# Patient Record
Sex: Female | Born: 1955 | Race: White | Hispanic: No
Health system: Southern US, Academic
[De-identification: ages and names within clinical notes are randomized; demographics above are authoritative.]

## PROBLEM LIST (undated history)

## (undated) DIAGNOSIS — E119 Type 2 diabetes mellitus without complications: Secondary | ICD-10-CM

## (undated) DIAGNOSIS — E7800 Pure hypercholesterolemia, unspecified: Secondary | ICD-10-CM

## (undated) DIAGNOSIS — K219 Gastro-esophageal reflux disease without esophagitis: Secondary | ICD-10-CM

## (undated) DIAGNOSIS — M129 Arthropathy, unspecified: Secondary | ICD-10-CM

## (undated) DIAGNOSIS — N281 Cyst of kidney, acquired: Secondary | ICD-10-CM

## (undated) DIAGNOSIS — H7290 Unspecified perforation of tympanic membrane, unspecified ear: Secondary | ICD-10-CM

## (undated) DIAGNOSIS — I10 Essential (primary) hypertension: Secondary | ICD-10-CM

## (undated) HISTORY — PX: HX EAR SURGERY: 2100001180

## (undated) HISTORY — PX: ANTERIOR CERVICAL DISCECTOMY W/ FUSION: SHX1161

## (undated) HISTORY — PX: KNEE ARTHROSCOPY: SUR90

---

## 1980-11-22 ENCOUNTER — Other Ambulatory Visit (HOSPITAL_COMMUNITY): Payer: Self-pay | Admitting: OBSTETRICS/GYNECOLOGY

## 2008-04-29 ENCOUNTER — Ambulatory Visit (INDEPENDENT_AMBULATORY_CARE_PROVIDER_SITE_OTHER): Payer: Self-pay | Admitting: Audiologist

## 2008-04-29 ENCOUNTER — Ambulatory Visit (INDEPENDENT_AMBULATORY_CARE_PROVIDER_SITE_OTHER): Payer: Self-pay | Admitting: Otolaryngology

## 2008-04-30 ENCOUNTER — Ambulatory Visit (INDEPENDENT_AMBULATORY_CARE_PROVIDER_SITE_OTHER): Payer: Auto Insurance (includes no fault) | Admitting: Otolaryngology

## 2008-04-30 ENCOUNTER — Ambulatory Visit (INDEPENDENT_AMBULATORY_CARE_PROVIDER_SITE_OTHER): Payer: Auto Insurance (includes no fault) | Admitting: Audiologist
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
## 2008-04-30 NOTE — Progress Notes (Signed)
I saw and evaluated the patient. I reviewed the resident's note. I agree with the findings and plan of care as documented in the resident's note. Any exceptions/additions are noted.

## 2008-06-20 ENCOUNTER — Ambulatory Visit (INDEPENDENT_AMBULATORY_CARE_PROVIDER_SITE_OTHER): Payer: Auto Insurance (includes no fault) | Admitting: Audiologist

## 2008-06-20 NOTE — Progress Notes (Signed)
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  F/u with Dr. Claybon Jabs is recommended. SH

## 2008-06-25 NOTE — Progress Notes (Addendum)
 Auditory Brainstem Response  Click Neurologic, Threshold and 500 Hz Tone Bursts completed.  Neurologically, all absolute and interwave latencies are WNL bilat. At 90 dBnHL.  Wave V followed down to 20 dBnHL for the Left and down to 30 dBnHL for the Right.  Wave V for 500 Hz Tone Bursts observed down to 30 (10 dB correction factor applied,  Places Wave V at 20 dBnHL) for both ears.  IMP: Hearing is WNL bilaterally at 09-3998 Hz and at 500 Hz.  NOTE: Waveform morphology not as clear for Right ear. Pt. Reports that she had some type of  Ear surgery in the right ear at age 52.

## 2008-07-02 ENCOUNTER — Ambulatory Visit (INDEPENDENT_AMBULATORY_CARE_PROVIDER_SITE_OTHER): Payer: Self-pay | Admitting: Otolaryngology

## 2008-07-02 NOTE — Telephone Encounter (Signed)
Triage Queue message copied by Maceo Pro on Tue Jul 02, 2008 10:40 AM  ------   Message from: Arva Chafe   Created: Tue Jul 02, 2008 10:38 AM    >> Arva Chafe Tue Jul 02, 2008 10:38 am  Claybon Jabs pt  This pt would like to know if Dr Claybon Jabs needs to see her again about her hearing. Please call pt to advise.

## 2008-07-10 NOTE — Telephone Encounter (Signed)
I left message on her voice mail that next step is hearing and I do not necessarily need to see her  [facility]  ----- Message -----  From: Timmothy Sours  Sent: Jul 02, 2008 10:40 AM  To: Maceo Pro

## 2008-07-11 ENCOUNTER — Ambulatory Visit (INDEPENDENT_AMBULATORY_CARE_PROVIDER_SITE_OTHER): Payer: Self-pay | Admitting: Otolaryngology

## 2008-07-11 NOTE — Telephone Encounter (Signed)
 Medical records faxed.

## 2008-07-11 NOTE — Telephone Encounter (Signed)
Triage Queue message copied by Valetta Fuller on Thu Jul 11, 2008 3:43 PM  ------   Message from: Arva Chafe   Created: Thu Jul 11, 2008 2:52 PM    >> Arva Chafe Thu Jul 11, 2008 2:52 pm  Claybon Jabs pt  This pt said her PCP, Dr Ilsa Iha would like all of Bette's test results and office notes faxed to [phone]. Thank you

## 2018-09-19 IMAGING — US ABD LIMITED
1 series · 14 of 25 positions shown · non-contrast
Comparison: Ultrasound report dated 03/12/2014.

EXAM:  SILAWAT PROFESSIONAL READ ABD U/S LMTD
INDICATION: K 76.0.

[Series 1: abd limited · 14 of 60 slices shown]
[im 1/60]
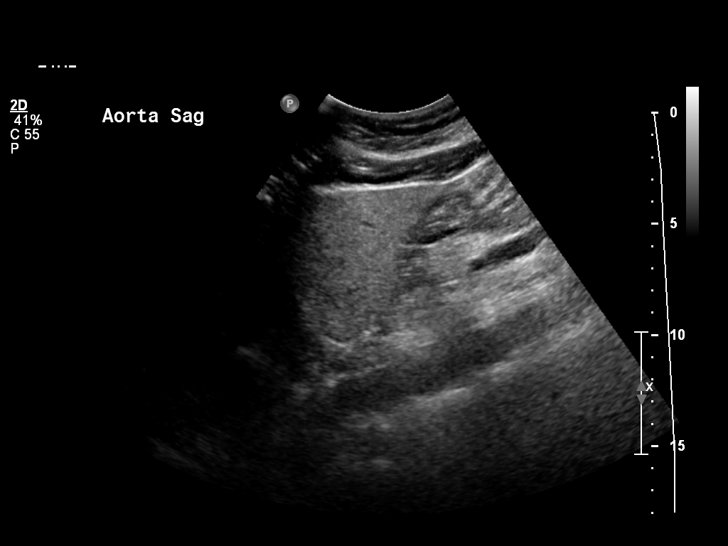
[im 5/60]
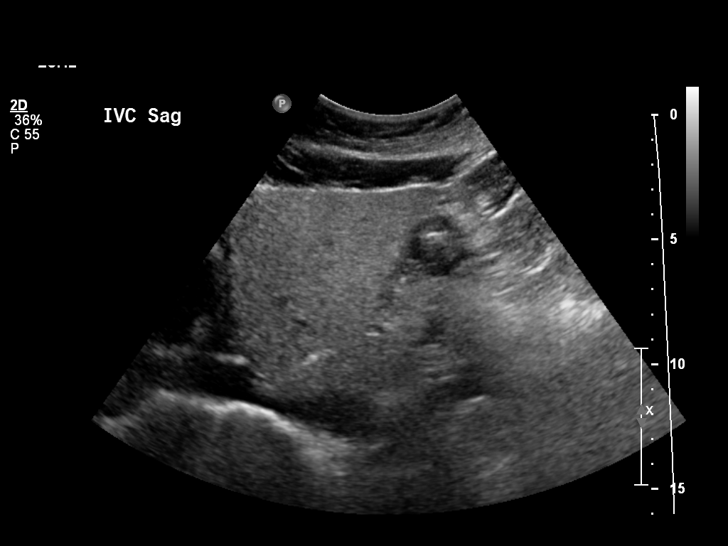
[im 10/60]
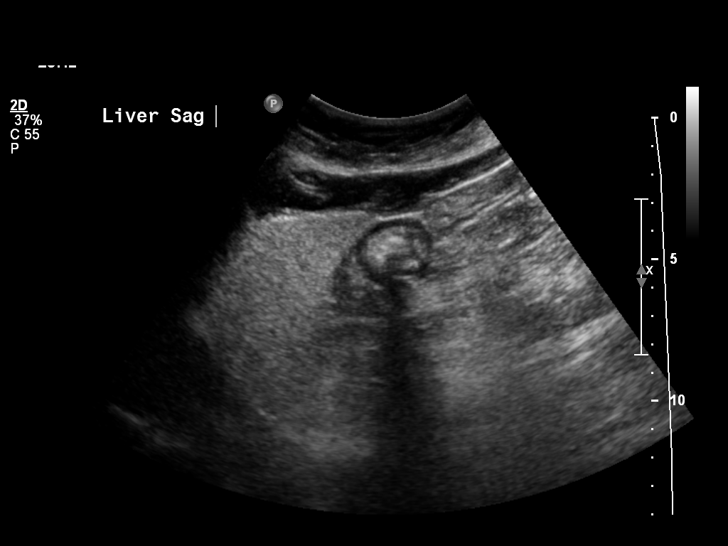
[im 15/60]
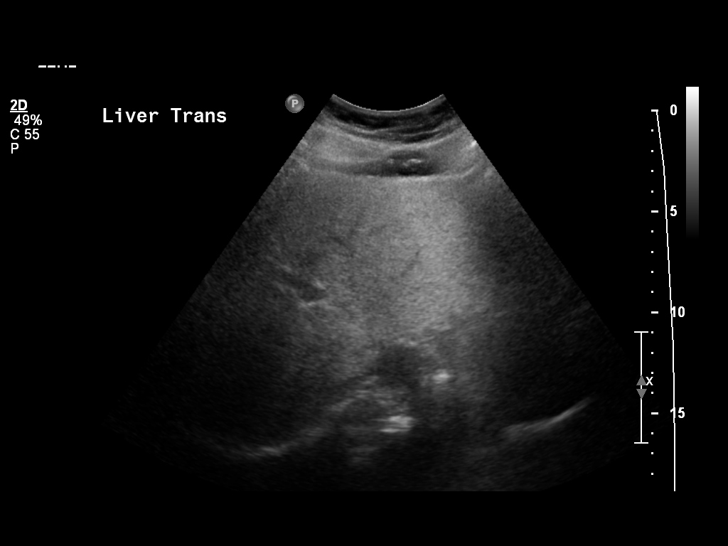
[im 20/60]
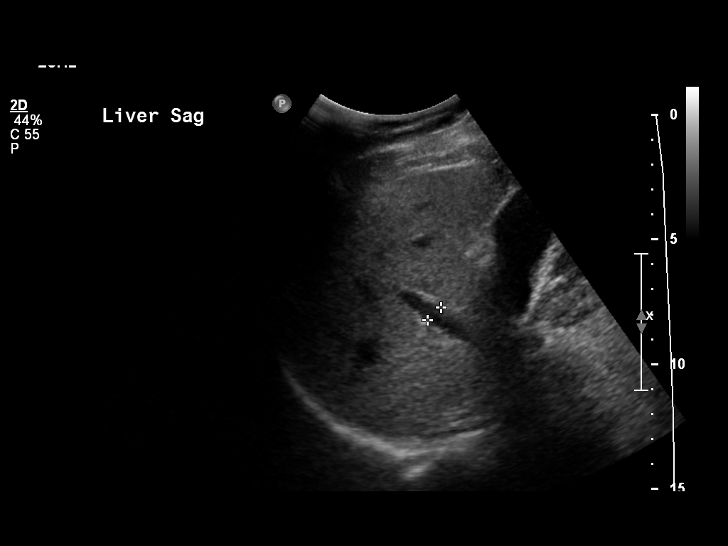
[im 23/60]
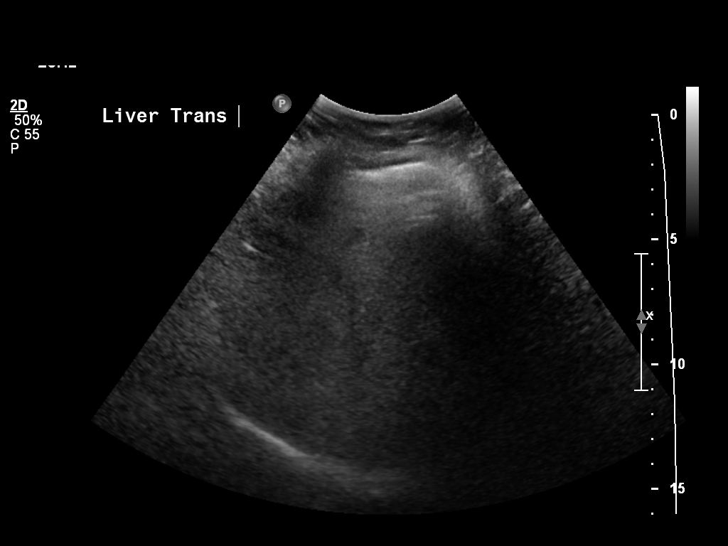
[im 28/60]
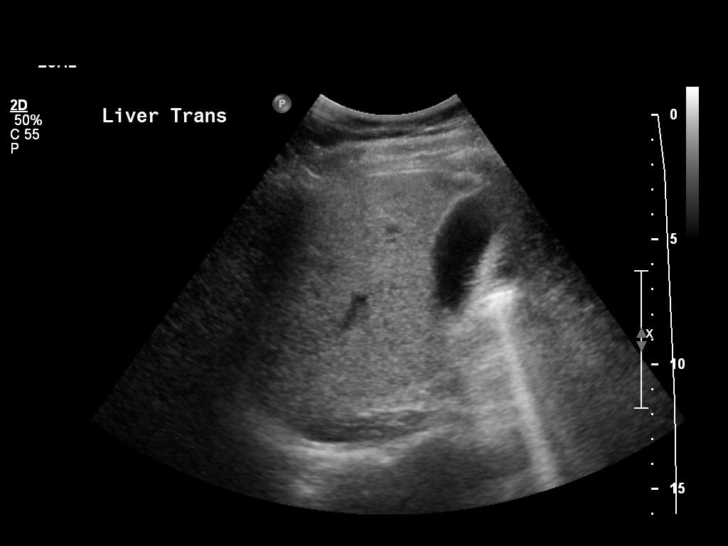
[im 32/60]
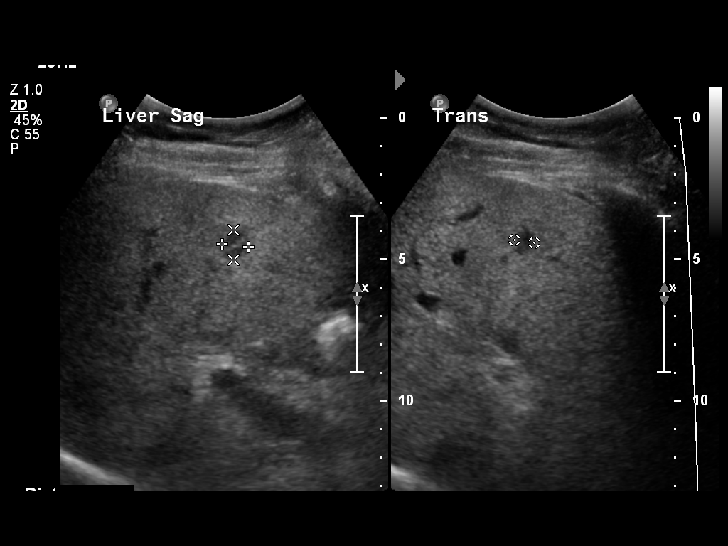
[im 37/60]
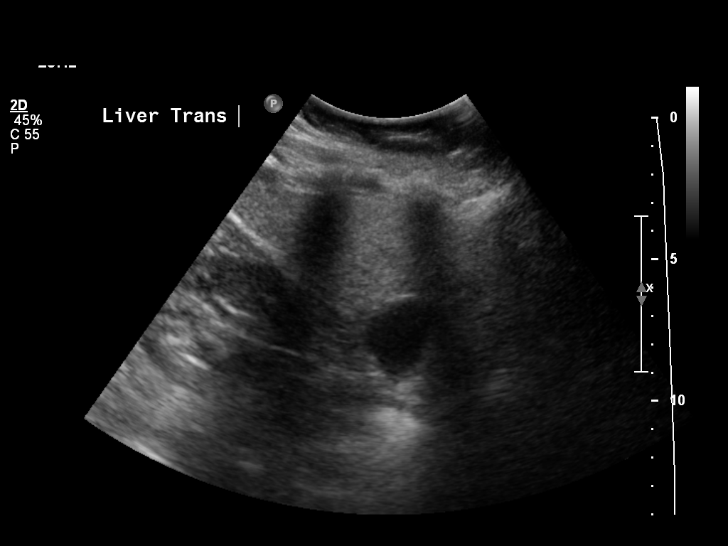
[im 40/60]
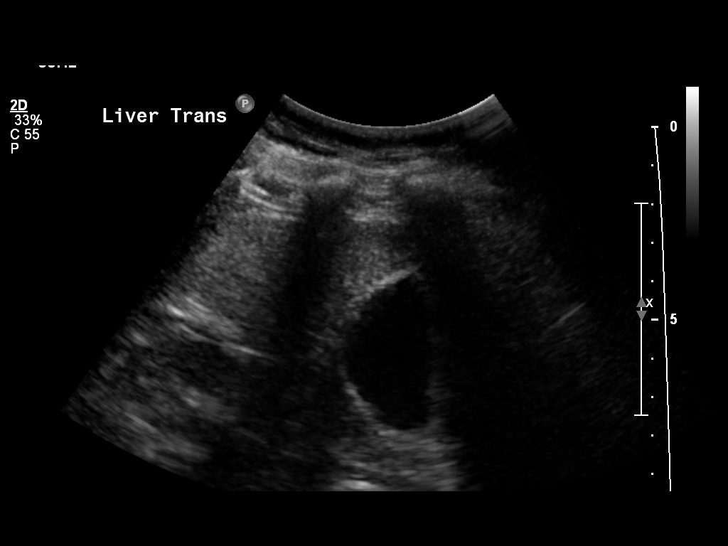
[im 45/60]
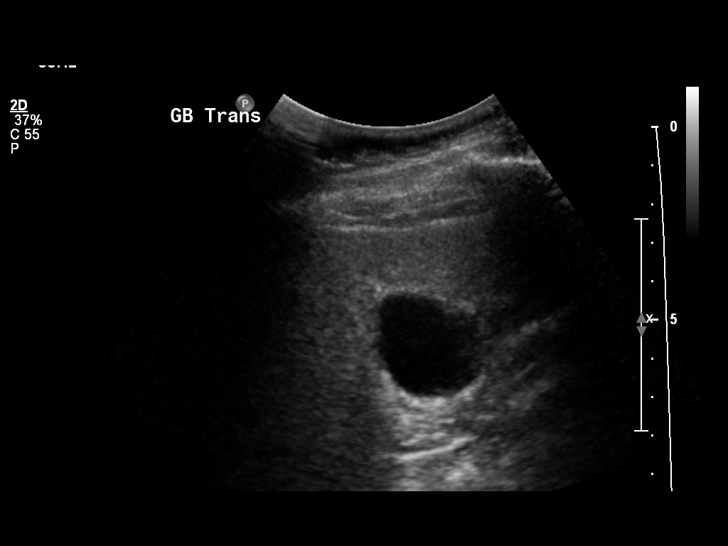
[im 50/60]
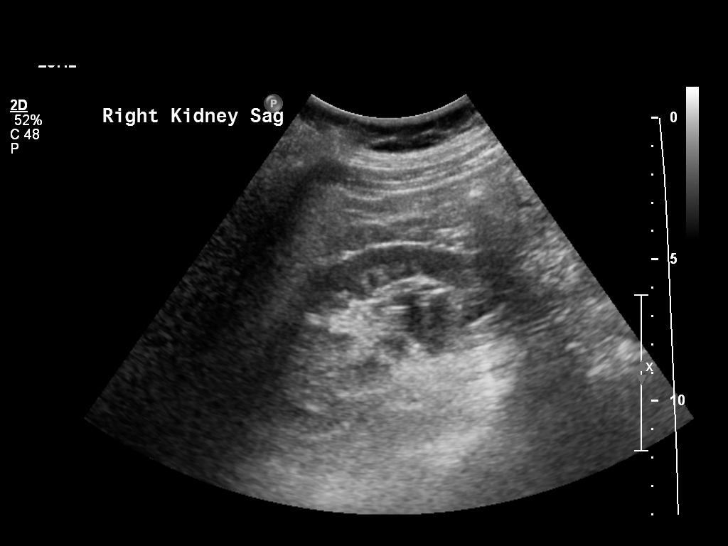
[im 55/60]
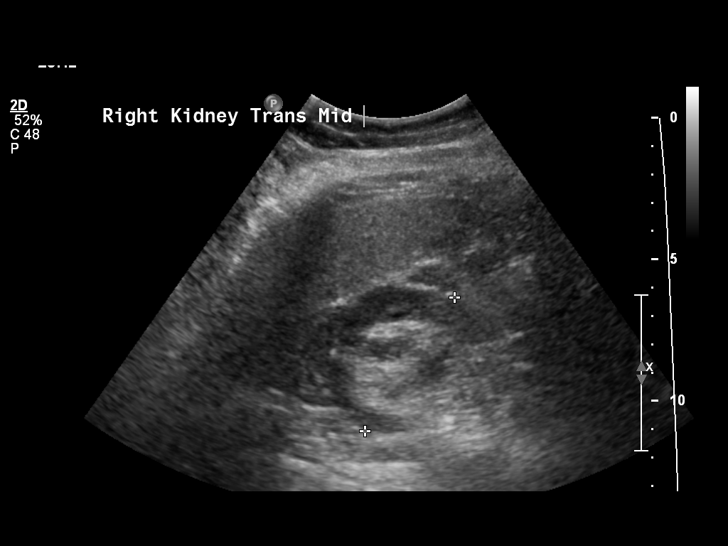
[im 60/60]
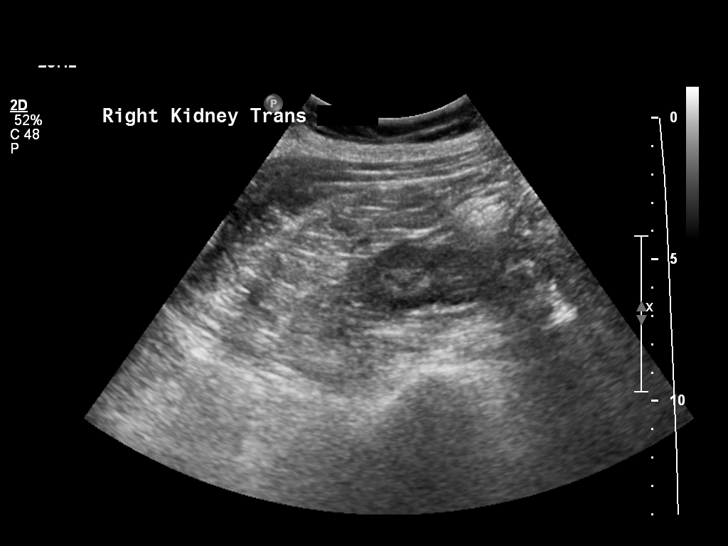

[14 of 25 positions shown; findings below may reference images not displayed]

FINDINGS: Liver is echogenic compatible with fatty infiltration. Fatty infiltration limits evaluation for focal hepatic mass. There is no intra or extrahepatic biliary ductal dilatation. Common bile duct measures 3.5 mm. No sludge or shadowing gallstones are seen. There is no gallbladder wall thickening or pericholecystic fluid. Pancreas is incompletely visualized due to artifact from overlying bowel gas. Right kidney measures 10.5 cm and is normal.

Visualized abdominal aorta is without aneurysmal dilatation. IVC is normal. Portal vein measures 7 mm in diameter and demonstrates hepatopetal flow. Hepatic veins are also patent. There is no ascites.
IMPRESSION: 1. Fatty liver. 

2. No evidence of cholelithiasis or acute cholecystitis. 

3. Pancreas incompletely visualized due to artifact from overlying bowel gas.

## 2020-01-10 IMAGING — MG 3D SCREENING MAMMO BIL W/CAD
5 series · 7 of 24 positions shown · non-contrast
Comparison: 06/26/2011 and 03/17/2010.

------------- REPORT [id number] -------------
[facility]
[address]
Daina Ms.ADDE, NKARA:
We wish to report the following on your recent mammography examination. We are sending a report to your referring physician or other health care provider. 
(       Normal/Negative:
No evidence of cancer.
This statement is mandated by the [place], Department of Health.
Your examination was performed by one of our technologists, who are registered radiological technologists and also specially certified in mammography:
___
Parlak, Edaly (M)
Nepomuceno, Martinez (M)

Your mammogram was interpreted by our radiologist.
( 
Sofeine Made, M.D.
(Annual Breast Examination by a physician or other health care provider
(Annual Mammography Screening beginning at age 40
(Monthly Breast Self Examination
------------- REPORT [id number] -------------
DE DIEU, GACEM
YIMI SOLIVAN,PA
EXAM:  3D BILATERAL ANNUAL SCREENING DIGITAL MAMMOGRAM WITH CAD AND TOMOSYNTHESIS
INDICATION: Screening.

[Series 5709: R CC · right · 2 of 2 slices shown]
[im 1/2]
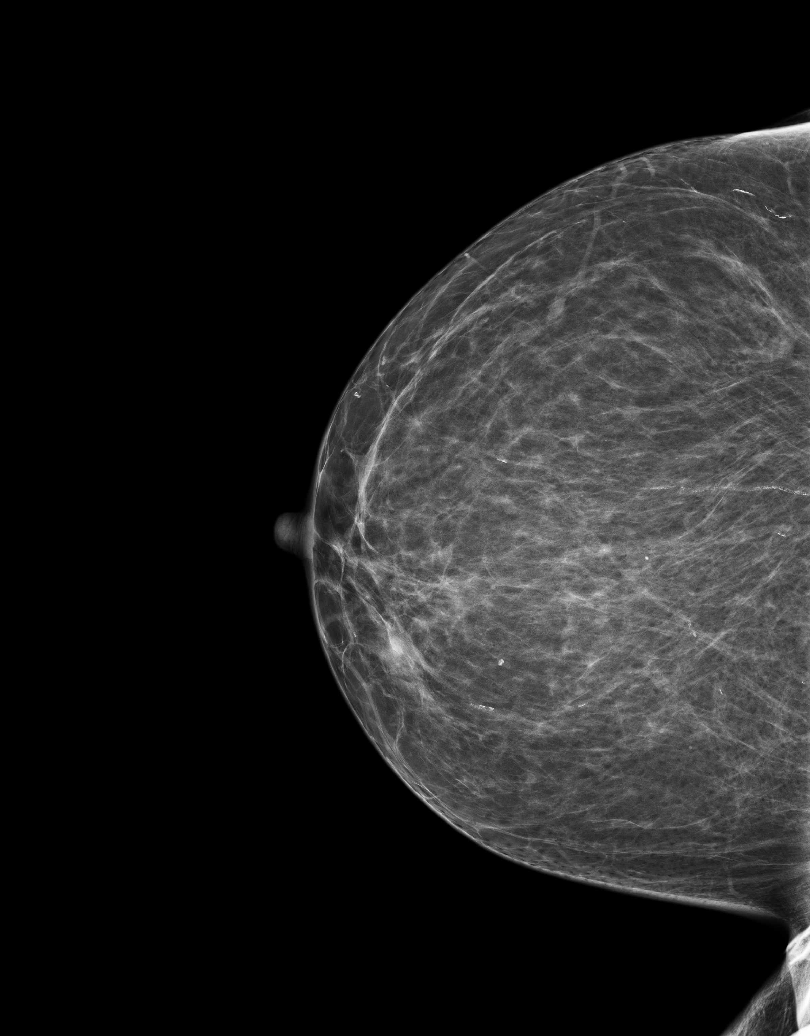
[im 2/2]
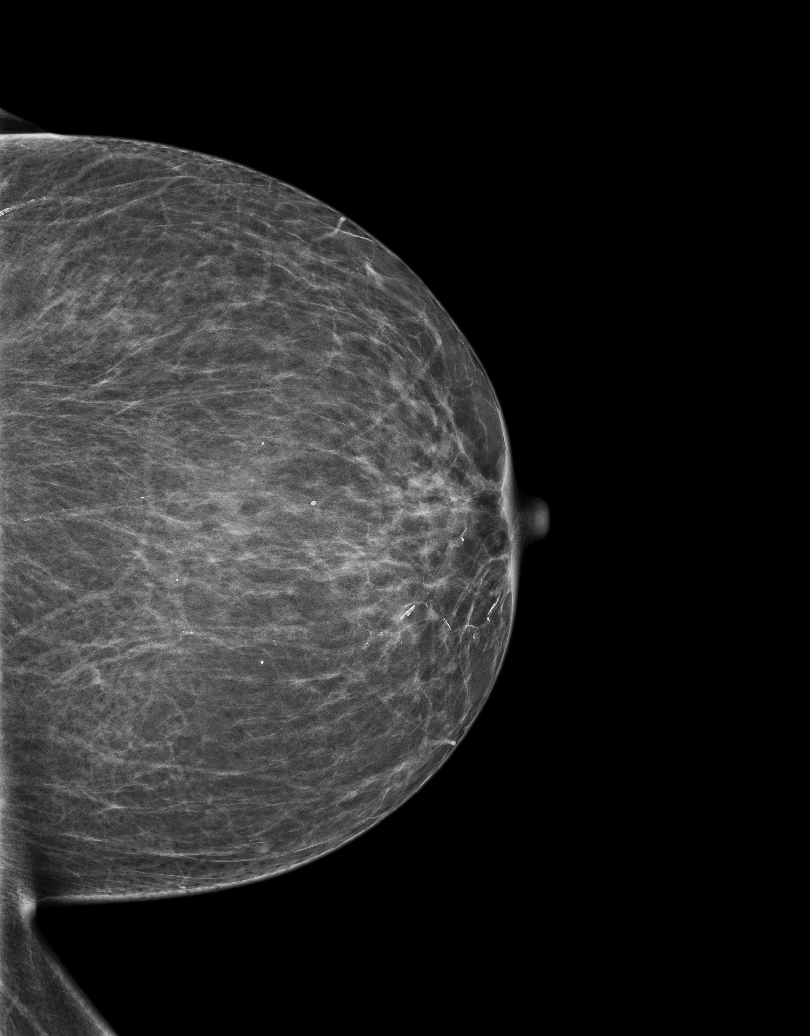

[Series 5711: 3D SCREENING MAMMO BIL W/CAD · 2 acquisitions, 2 frames shown (1 of 2)]
[im 1/2]
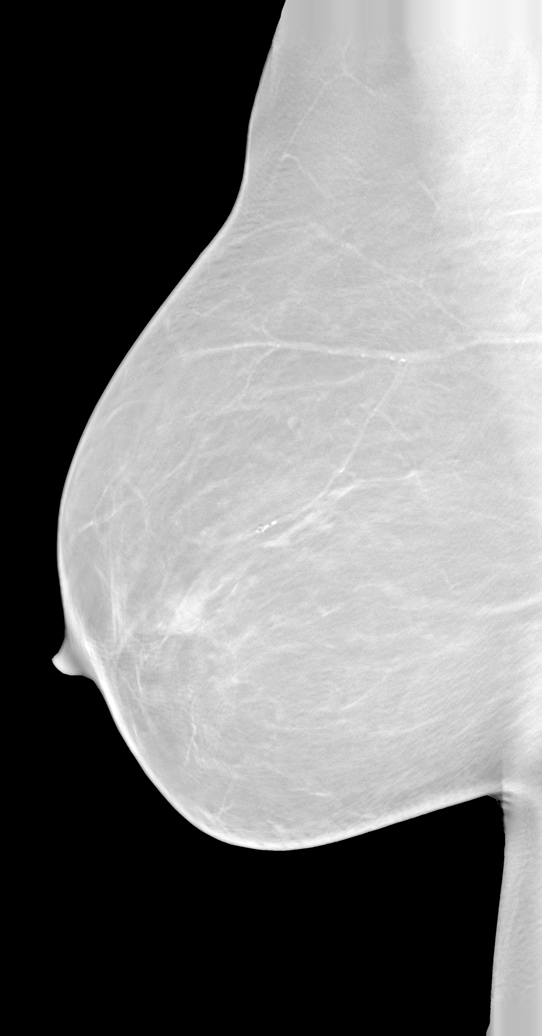
[im 2/2]
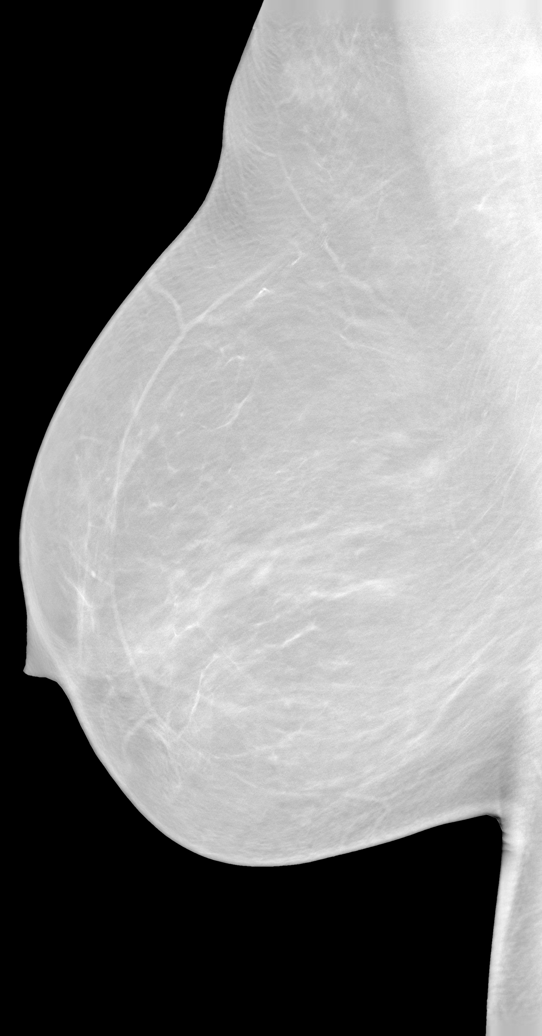

[L]
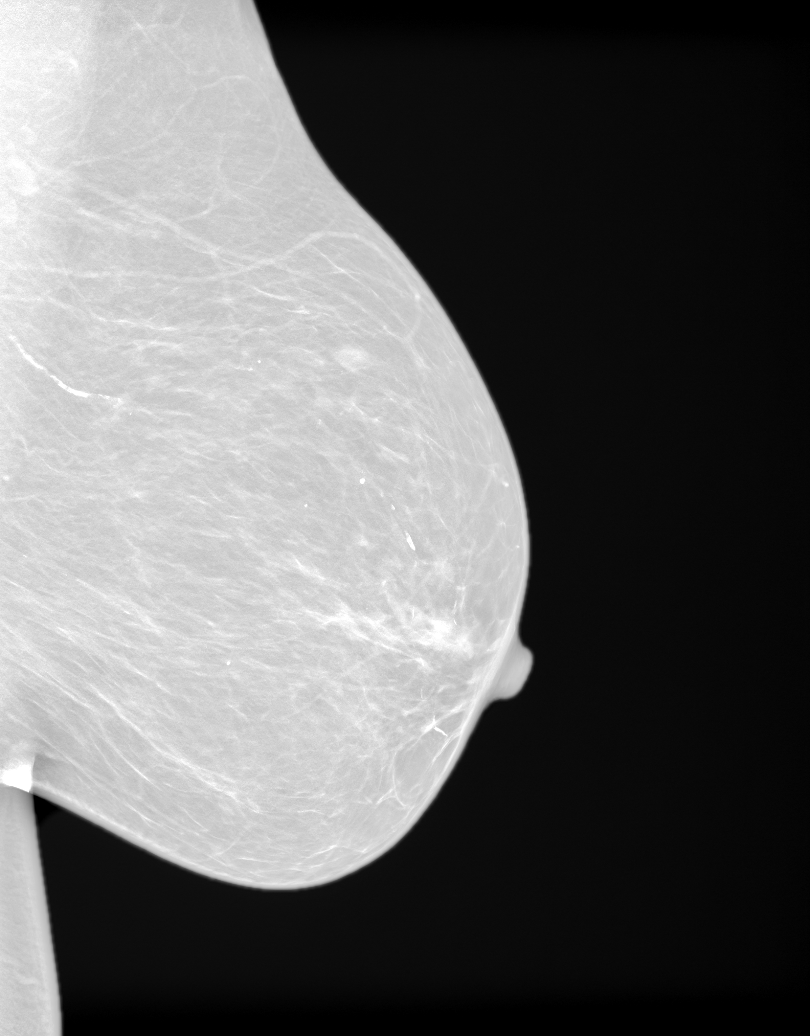

[3D SCREENING MAMMO BIL W/CAD (2 of 2) · tomo slice 11/70.0]
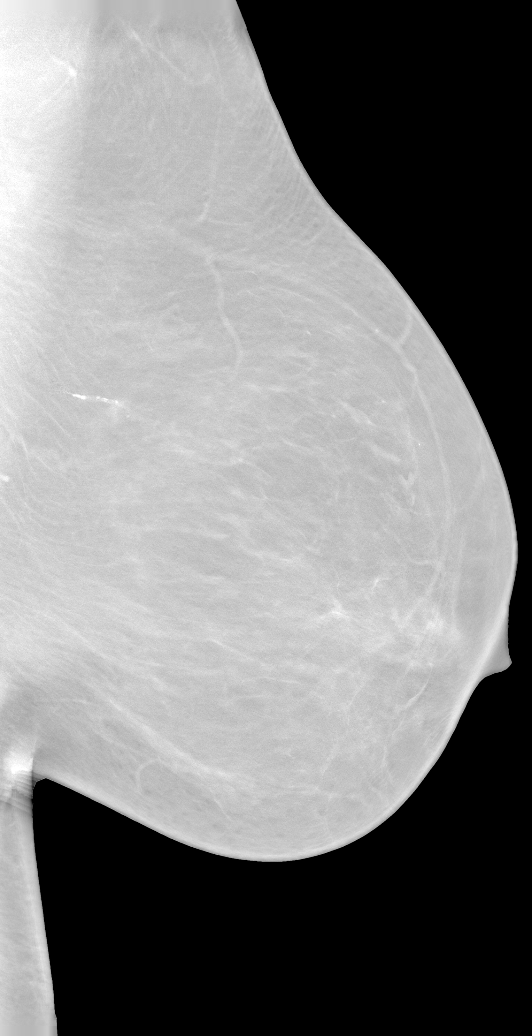

[R]
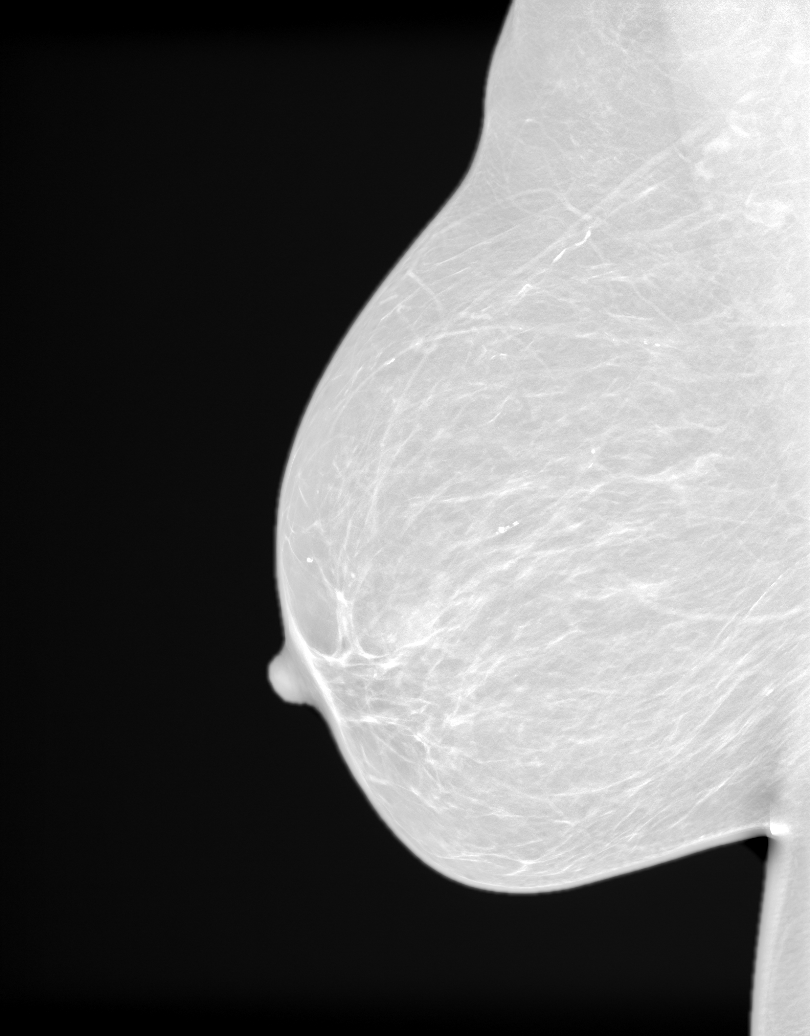

[7 of 24 positions shown; findings below may reference images not displayed]

FINDINGS: There are scattered fibroglandular elements.  There is no mass or suspicious cluster of microcalcifications.   There is no architectural distortion, skin thickening or nipple retraction.
IMPRESSION: 1.  BIRADS 2-Benign findings. Patient has been added in a reminder system with a target date for the next screening mammography.

2.  DENSITY CODE – B (Scattered areas of fibroglandular density). 

Final Assessment Code:

Bi-Rads 2 

BI-RADS 0
Need additional imaging evaluation.

BI-RADS 1
Negative mammogram.

BI-RADS 2
Benign finding.

BI-RADS 3
Probably benign finding; short-interval follow-up suggested.

BI-RADS 4
Suspicious abnormality; biopsy should be considered.

BI-RADS 5
Highly suggestive of malignancy; appropriate action should be taken.

BI-RADS 6
Known biopsy-proven malignancy; appropriate action should be taken.

NOTE:
In compliance with Federal regulations, the results of this mammogram are being sent to the patient.

## 2020-07-02 ENCOUNTER — Other Ambulatory Visit (HOSPITAL_COMMUNITY): Payer: Self-pay

## 2020-07-02 LAB — EXTERNAL COVID-19 MOLECULAR RESULT: External 2019-n-CoV/SARS-CoV-2: POSITIVE — AB

## 2021-01-07 DIAGNOSIS — K219 Gastro-esophageal reflux disease without esophagitis: Secondary | ICD-10-CM | POA: Insufficient documentation

## 2021-01-07 DIAGNOSIS — I10 Essential (primary) hypertension: Secondary | ICD-10-CM | POA: Insufficient documentation

## 2021-01-07 DIAGNOSIS — F4321 Adjustment disorder with depressed mood: Secondary | ICD-10-CM | POA: Insufficient documentation

## 2021-01-07 DIAGNOSIS — E119 Type 2 diabetes mellitus without complications: Secondary | ICD-10-CM | POA: Insufficient documentation

## 2021-01-07 DIAGNOSIS — R5383 Other fatigue: Secondary | ICD-10-CM | POA: Insufficient documentation

## 2021-01-07 DIAGNOSIS — E7800 Pure hypercholesterolemia, unspecified: Secondary | ICD-10-CM | POA: Insufficient documentation

## 2021-01-07 IMAGING — MR MRI CERVICAL SPINE WITHOUT AND WITH CONTRAST
7 of 8 series · 36 of 48 positions shown · IV contrast (gadavist)
Comparison: None previous.

﻿EXAM:  [id number]   MRI CERVICAL SPINE WITHOUT AND WITH CONTRAST
INDICATION: Chronic neck pain.  Radicular symptoms to both upper extremities.  Previous cervical spine surgery many years ago.
TECHNIQUE: Axial, coronal and sagittal images including postcontrast fat suppressed T1 series after administration of 5 mL Gadavist IV.

[Series 5: T2 · sagittal · 3.0mm · 0.78mm/px · 5 of 13 slices shown (1 of 2)]
[im 1/13]
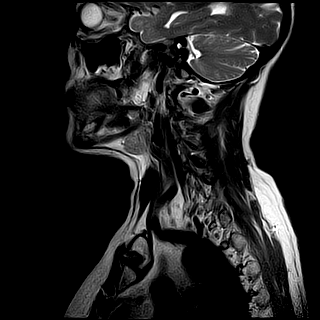
[im 4/13]
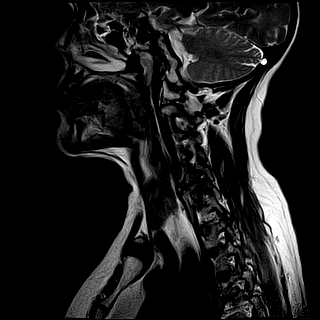
[im 7/13]
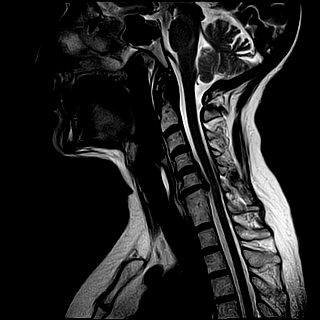
[im 10/13]
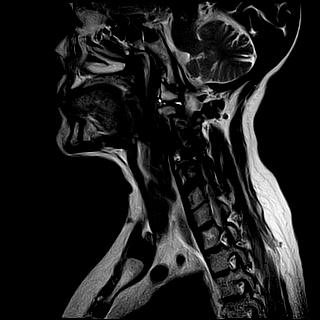
[im 13/13]
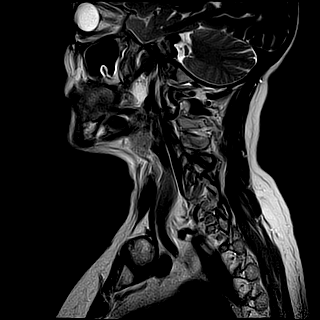

[Series 7: T1 · sagittal · 3.0mm · 0.49mm/px · 4 of 13 slices shown]
[im 1/13]
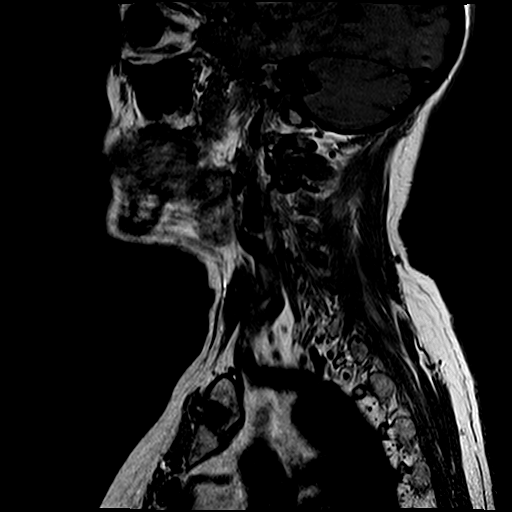
[im 5/13]
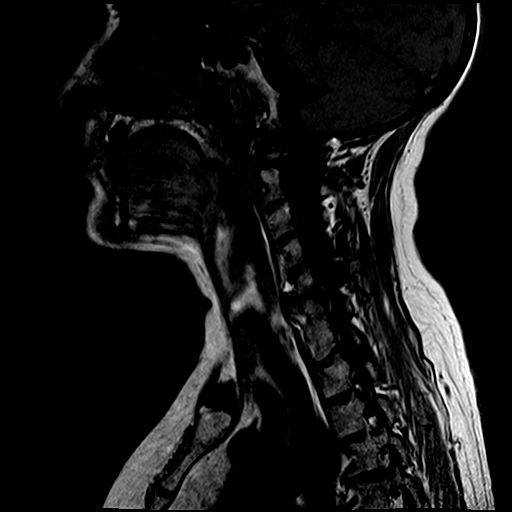
[im 9/13]
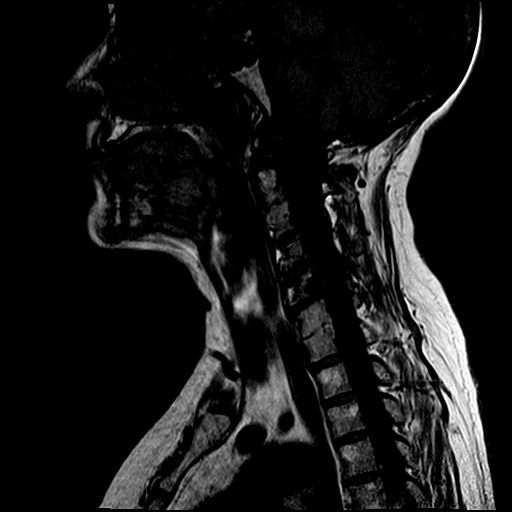
[im 13/13]
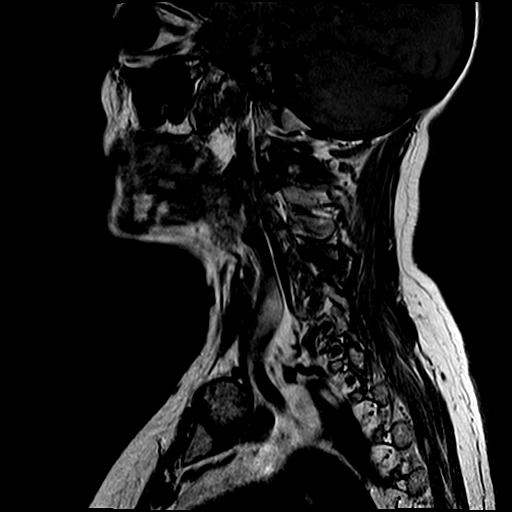

[Series 8: T1 fat-sat · sagittal · 3.0mm · 0.78mm/px · 4 of 13 slices shown (1 of 2)]
[im 1/13]
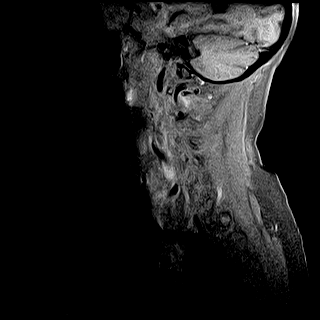
[im 5/13]
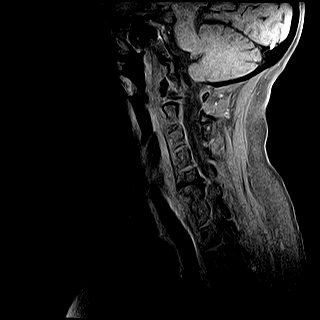
[im 9/13]
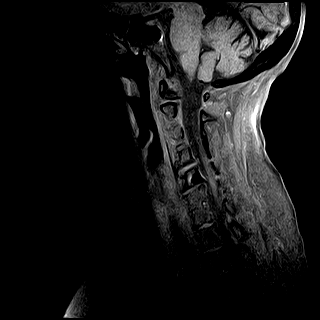
[im 13/13]
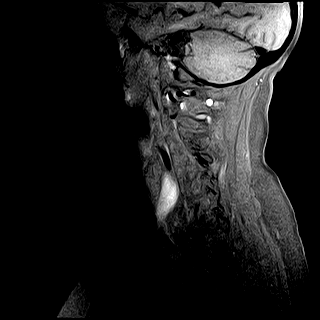

[Series 10: T2 · axial · 3.5mm · 0.40mm/px · z∈[-77,+8]mm · 9 of 26 slices shown (2 of 2)]
[im 1/26]
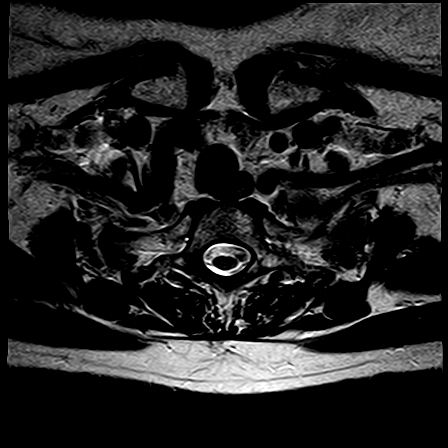
[im 4/26]
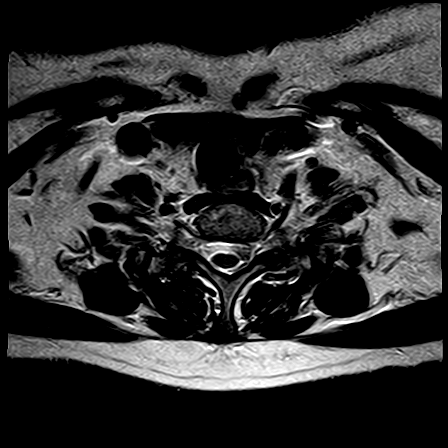
[im 7/26]
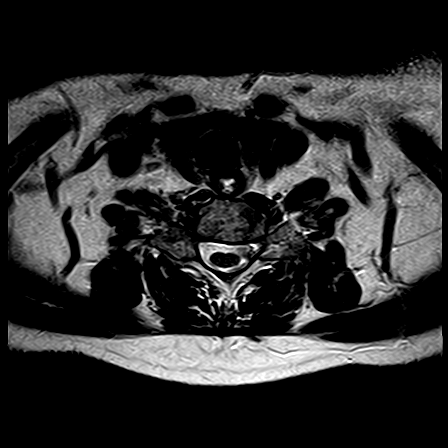
[im 10/26]
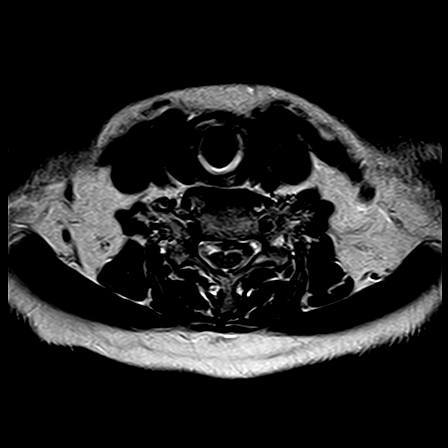
[im 13/26]
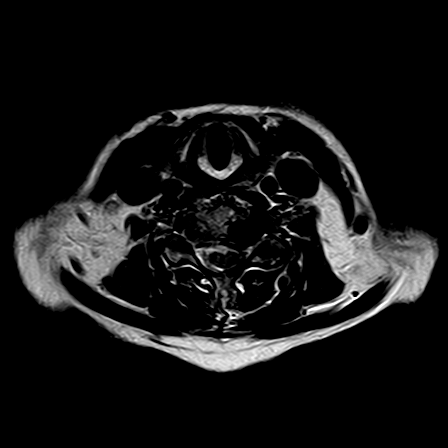
[im 16/26]
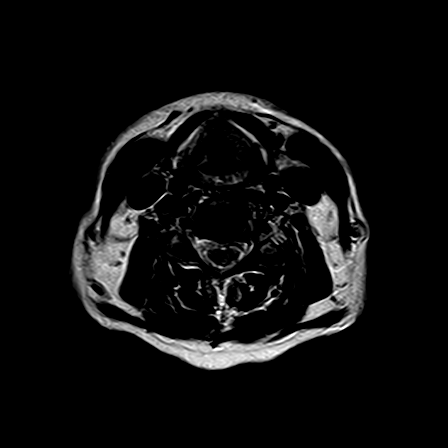
[im 19/26]
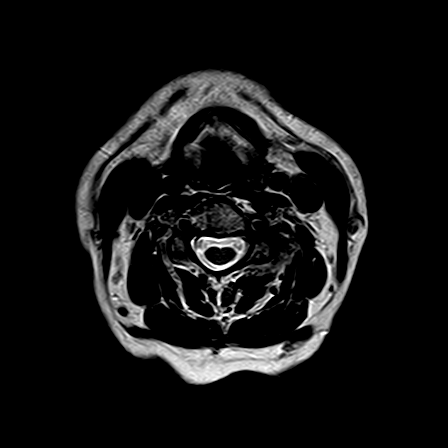
[im 22/26]
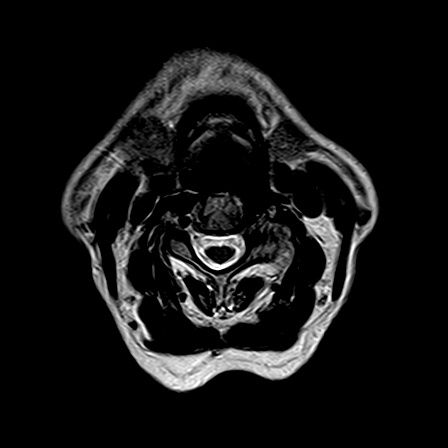
[im 26/26]
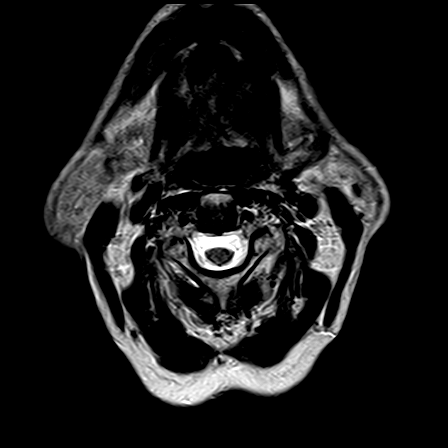

[Series 11: T1 fat-sat · axial · 3.5mm · 0.70mm/px · z∈[-77,+8]mm · 8 of 26 slices shown (2 of 2)]
[im 1/26]
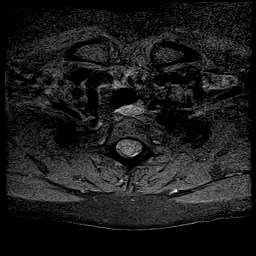
[im 4/26]
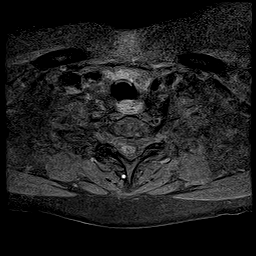
[im 7/26]
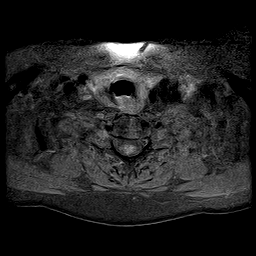
[im 10/26]
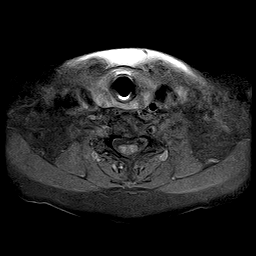
[im 16/26]
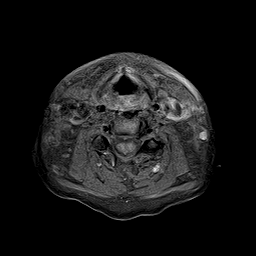
[im 19/26]
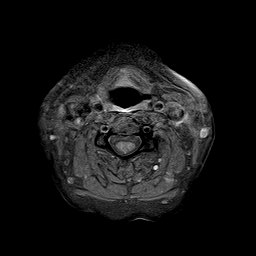
[im 22/26]
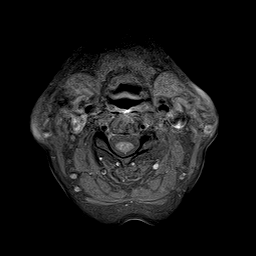
[im 26/26]
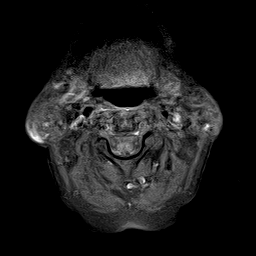

[Series 12: T1 fat-sat post-contrast · sagittal · 3.0mm · 0.78mm/px · 4 of 13 slices shown (1 of 2)]
[im 1/13]
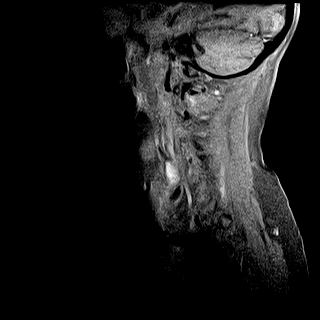
[im 5/13]
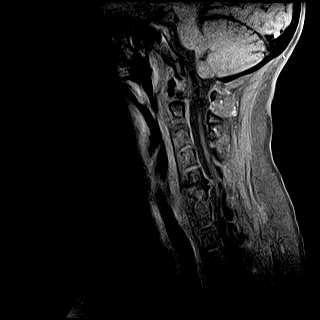
[im 9/13]
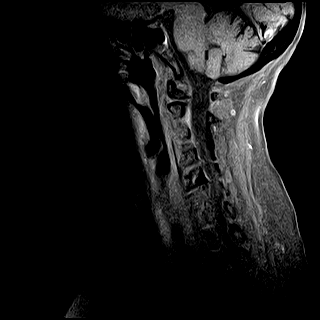
[im 13/13]
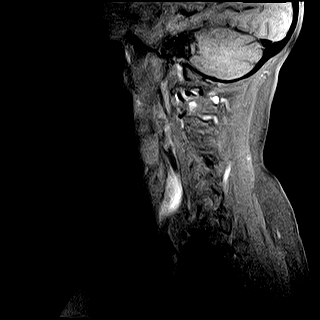

[Series 13: T1 fat-sat post-contrast · axial · 3.5mm · 0.70mm/px · z∈[-77,-66]mm · 2 of 26 slices shown (2 of 2)]
[im 1/26]
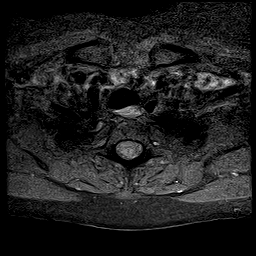
[im 4/26]
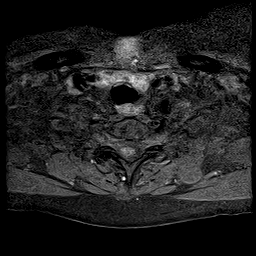

[36 of 48 positions shown; findings below may reference images not displayed]

FINDINGS: No acute bony lesions of cervical vertebrae are seen.  Posterior fossa and foramen magnum structures are normal in the lateral projection.  

No focal lesions at C2-C3 and C3-C4 disc levels. 

At C4-C5 level, disc osteophyte complex causing moderately significant left foraminal stenosis.  

At C5-C6 level, severe degenerative disc disease with disc osteophyte complex causing moderate compromise of thecal sac and severe compromise of the right neural foramen.  AP diameter of thecal sac in the midline measures 6.9 mm.  

Surgical fusion of C6 and C7 vertebrae is noted.  

At C7-T1 level, no focal disc pathology is noted.  

Cervical spinal cord shows no focal lesions.  No abnormal enhancement is noted on postcontrast study.
IMPRESSION: 1. Postsurgical changes with fusion at C6-C7 level.  

2. At C5-C6 level, severe degenerative disc disease with disc osteophyte complex causing moderate compromise of thecal sac and severe compromise of the right neural foramen.  AP diameter of thecal sac in the midline measures 6.9 mm.  

3. At C4-C5 level, disc osteophyte complex causing moderately significant left foraminal stenosis.

4. Findings at other levels are described above.  

5. No abnormal enhancement is noted on postcontrast examination.

## 2021-01-07 IMAGING — MR MRI LUMBAR SPINE WITHOUT CONTRAST
6 series · 42 of 48 positions shown · IV contrast (gadolinium)
Comparison: None previous.

﻿EXAM:  [id number]   MRI LUMBAR SPINE WITHOUT CONTRAST
INDICATION: Low back and bilateral lower extremity pain.
TECHNIQUE: Multiplanar, multisequential MRI of the lumbar spine was performed without gadolinium contrast.

[Series 5: T2 · sagittal · 4.0mm · 0.94mm/px · 5 of 13 slices shown (1 of 3)]
[im 1/13]
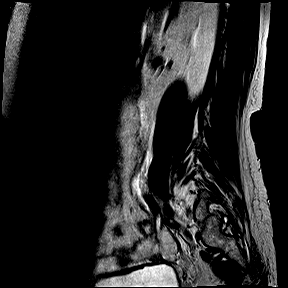
[im 4/13]
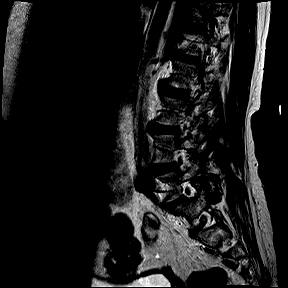
[im 7/13]
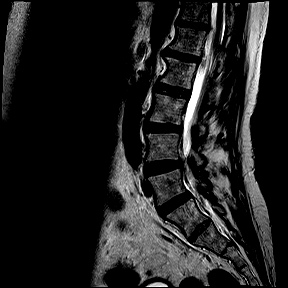
[im 10/13]
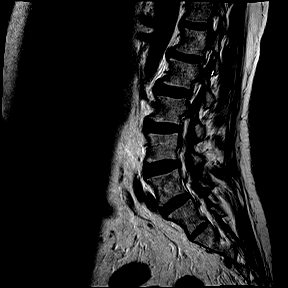
[im 13/13]
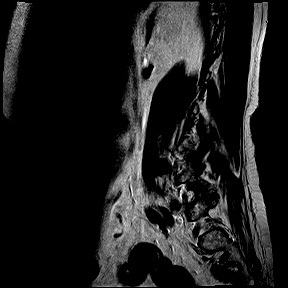

[Series 6: T1 · sagittal · 4.0mm · 0.94mm/px · 6 of 13 slices shown (1 of 2)]
[im 1/13]
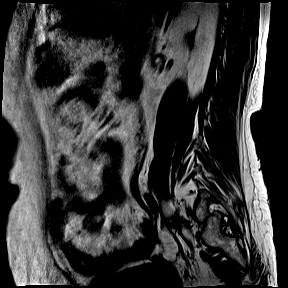
[im 3/13]
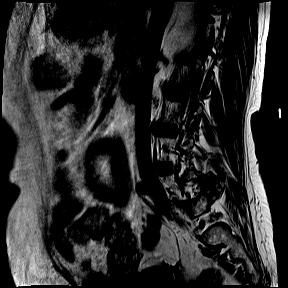
[im 5/13]
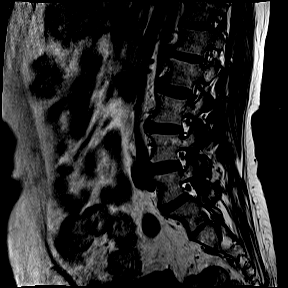
[im 8/13]
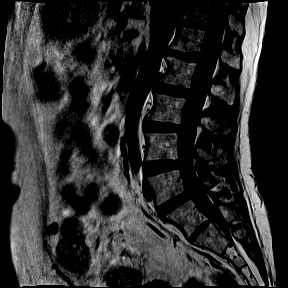
[im 10/13]
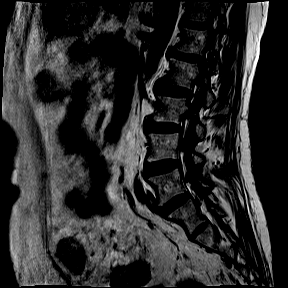
[im 13/13]
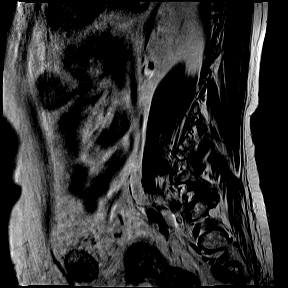

[Series 8: STIR · sagittal · 4.0mm · 1.05mm/px · 3 of 13 slices shown]
[im 1/13]
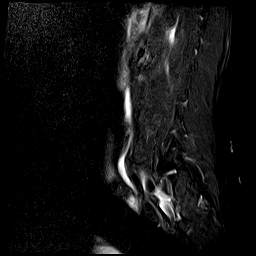
[im 3/13]
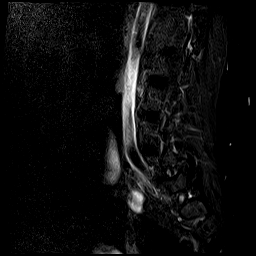
[im 5/13]
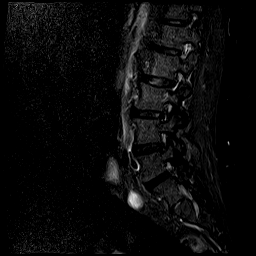

[Series 9: T2 · axial · 4.0mm · 0.47mm/px · z∈[-131,+102]mm · 11 of 26 slices shown (2 of 3)]
[im 1/26]
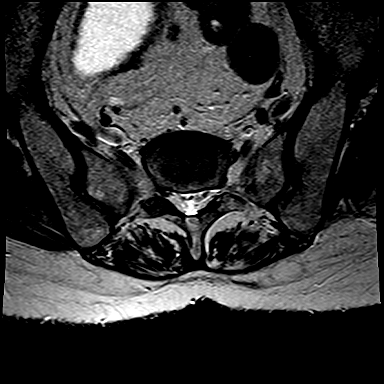
[im 3/26]
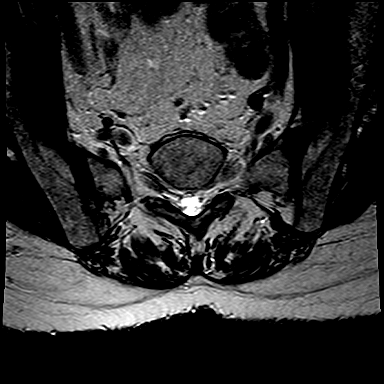
[im 6/26]
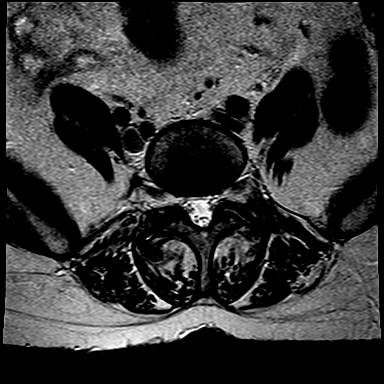
[im 8/26]
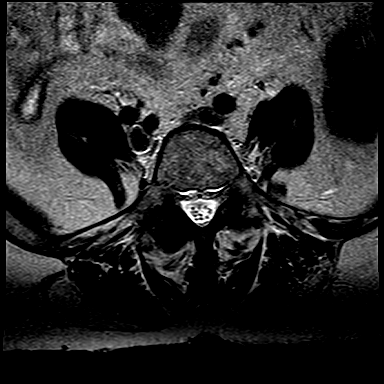
[im 11/26]
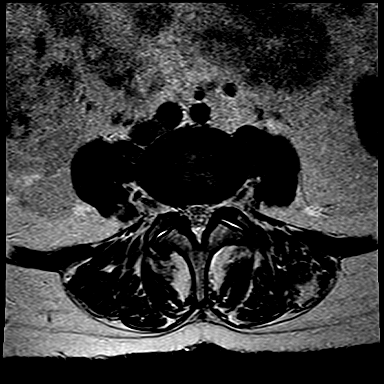
[im 13/26]
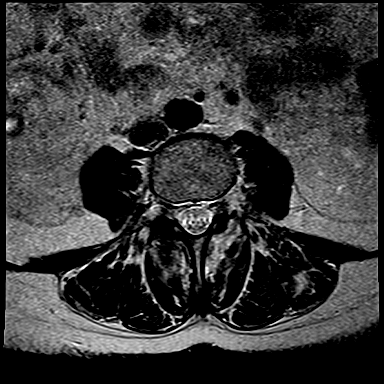
[im 16/26]
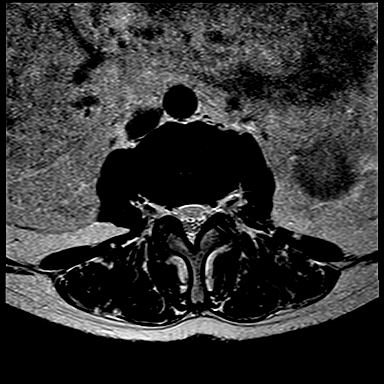
[im 18/26]
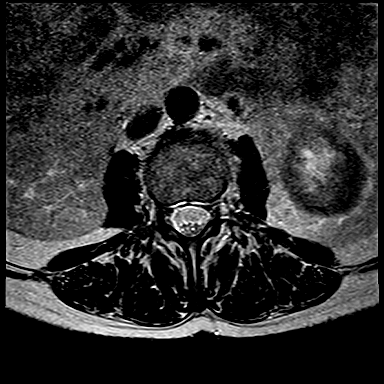
[im 21/26]
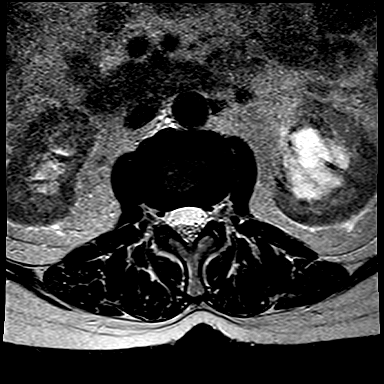
[im 23/26]
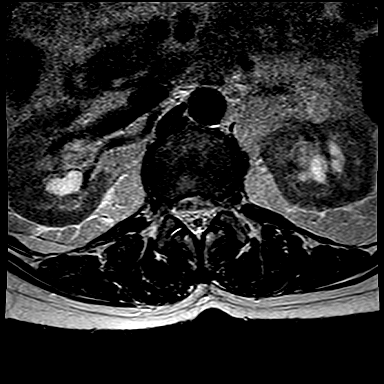
[im 26/26]
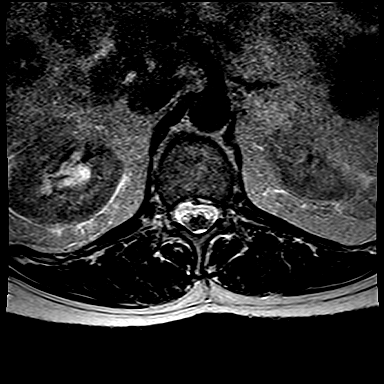

[Series 10: T1 · axial · 4.0mm · 0.47mm/px · z∈[-131,+102]mm · 8 of 26 slices shown (2 of 2)]
[im 1/26]
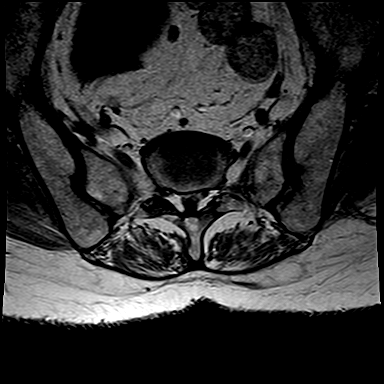
[im 6/26]
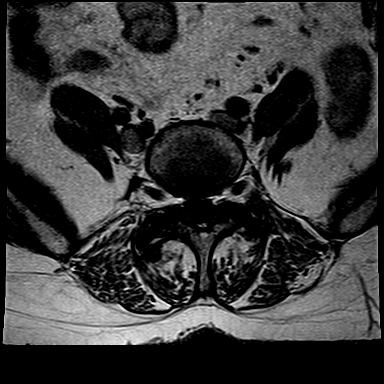
[im 8/26]
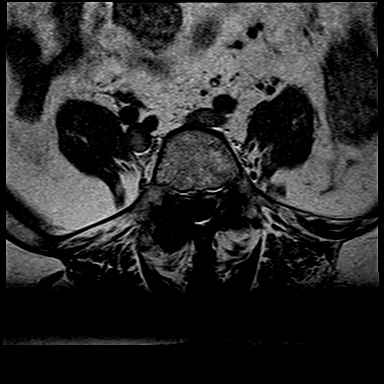
[im 11/26]
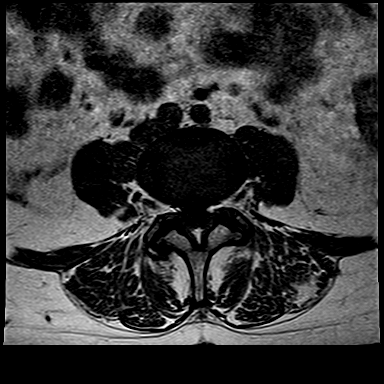
[im 16/26]
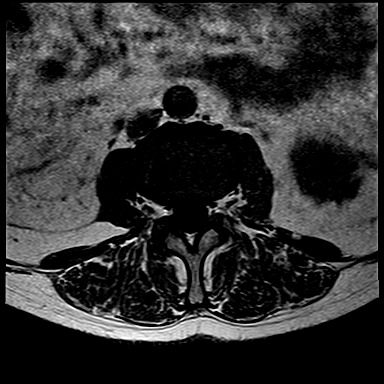
[im 18/26]
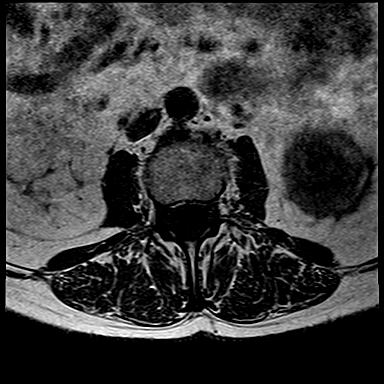
[im 21/26]
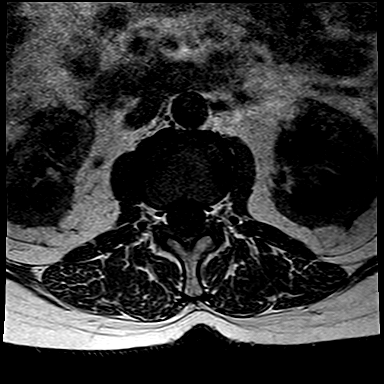
[im 26/26]
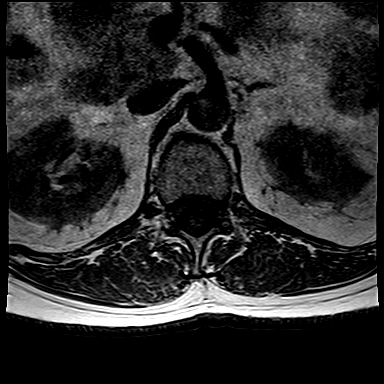

[Series 11: T2 · coronal · 4.0mm · 1.25mm/px · 9 of 20 slices shown (3 of 3)]
[im 1/20]
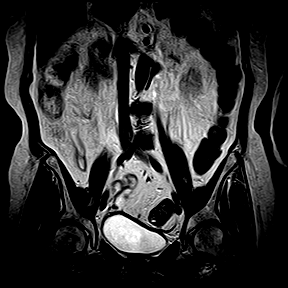
[im 3/20]
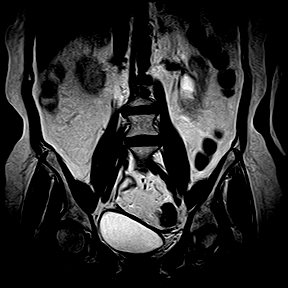
[im 5/20]
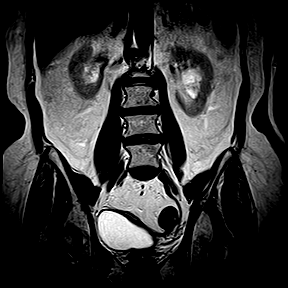
[im 8/20]
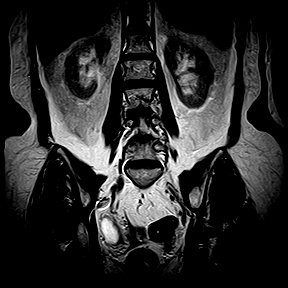
[im 10/20]
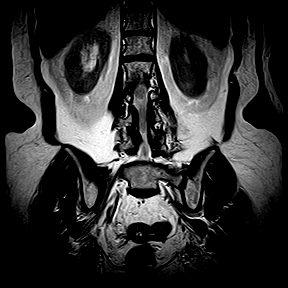
[im 12/20]
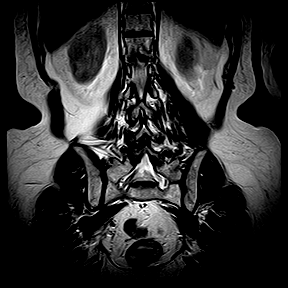
[im 15/20]
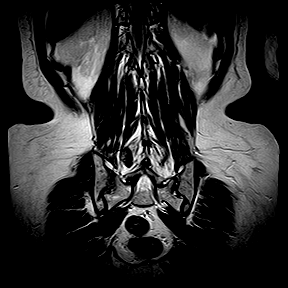
[im 17/20]
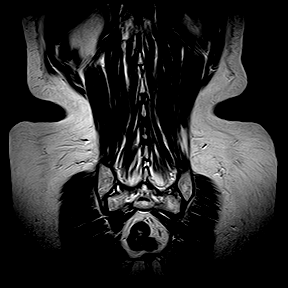
[im 20/20]
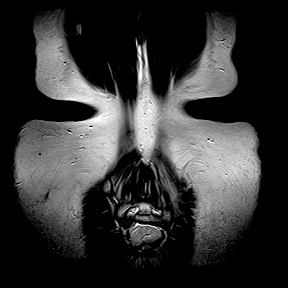

[42 of 48 positions shown; findings below may reference images not displayed]

FINDINGS: No acute bony lesions of lumbar vertebrae are seen.  Transitional lumbosacral junction is noted with lowest visible disc in sagittal projection labeled as S1-S2 for the purposes of this report.  

Lower spinal cord and cauda equina are normal.  

At L1-L2 disc level, degenerative disc disease is noted with asymmetric bulging annulus to the left of the midline causing mild compromise of lateral recess and thecal sac to the left of the midline at this level.  

At L2-L3 level, no focal disc lesions are seen.  

At L3-L4 level, degenerative disc disease and facet arthropathy causing mild compromise of both lateral recesses.  

At L4-L5 level, significant bilateral facet arthropathy is noted with degenerative bulging annulus causing significant compromise of both lateral recesses, right more than the left.  AP diameter of thecal sac in the midline measures 8.1 mm.  

At L5-S1 level, mild compromise of neural foramina due to facet arthropathy.  

S1-S2 disc is normal.  

Paravertebral soft tissues are unremarkable.
IMPRESSION: 1. No acute findings.  Transitional lumbosacral junction with lowest visible disc labeled as S1-S2 for the purposes of this report.

2. At L4-L5 level, significant bilateral facet arthropathy is noted with degenerative bulging annulus causing significant compromise of both lateral recesses, right more than the left.  AP diameter of thecal sac in the midline measures 8.1 mm.  

3. Findings at other disc levels are described above in detail.

## 2021-08-06 IMAGING — MR MRI LUMBAR SPINE WITHOUT CONTRAST
5 of 6 series · 32 of 48 positions shown · IV contrast (gadolinium)
Comparison: MRI dated 01/07/2021.

﻿EXAM:  [id number]   MRI LUMBAR SPINE WITHOUT CONTRAST
INDICATION: Lower back pain with right leg pain and numbness.
TECHNIQUE: Multiplanar multisequential MRI of the lumbar spine was performed without gadolinium contrast.

[Series 5: T2 · sagittal · 4.0mm · 0.83mm/px · 6 of 13 slices shown (1 of 3)]
[im 1/13]
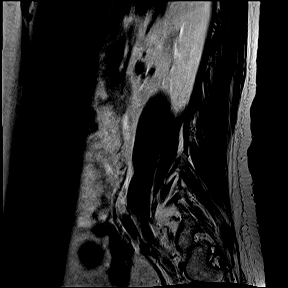
[im 3/13]
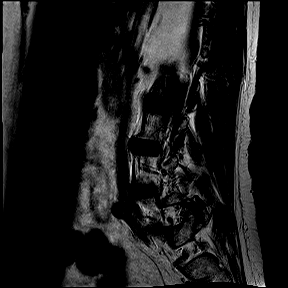
[im 5/13]
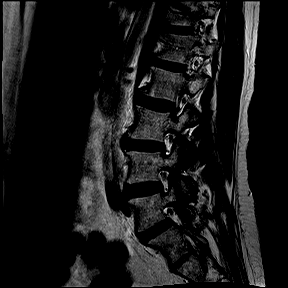
[im 8/13]
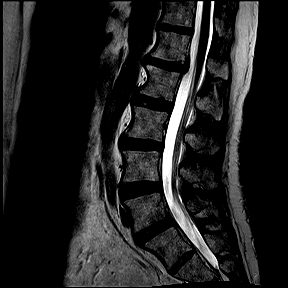
[im 10/13]
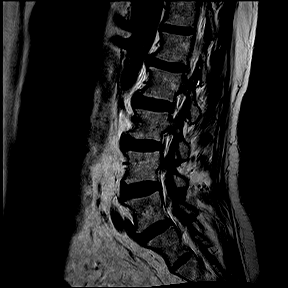
[im 13/13]
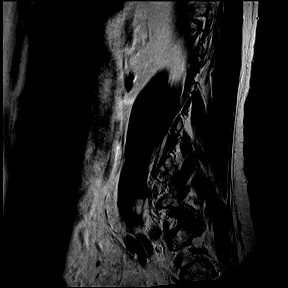

[Series 6: T1 · sagittal · 4.0mm · 0.83mm/px · 6 of 13 slices shown (1 of 2)]
[im 1/13]
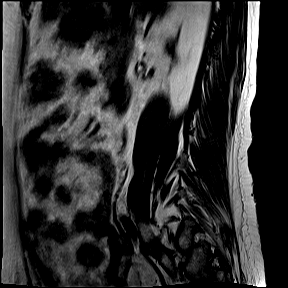
[im 3/13]
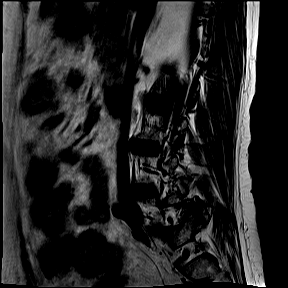
[im 5/13]
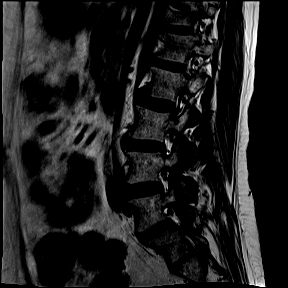
[im 8/13]
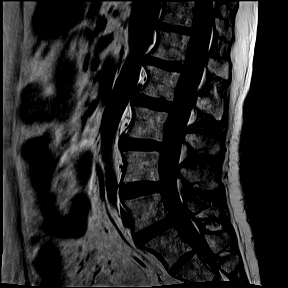
[im 10/13]
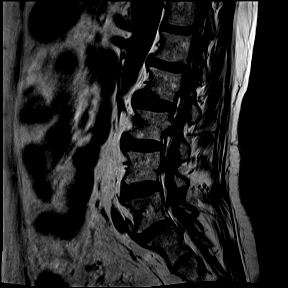
[im 13/13]
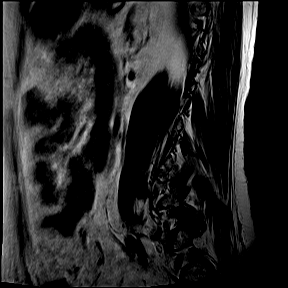

[Series 8: T2 · coronal · 5.0mm · 0.82mm/px · 8 of 18 slices shown (2 of 3)]
[im 1/18]
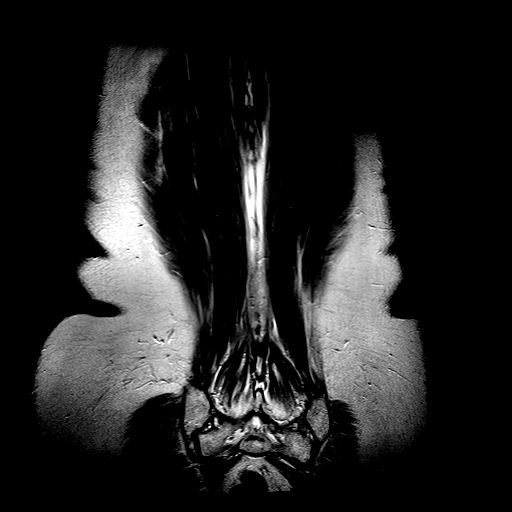
[im 3/18]
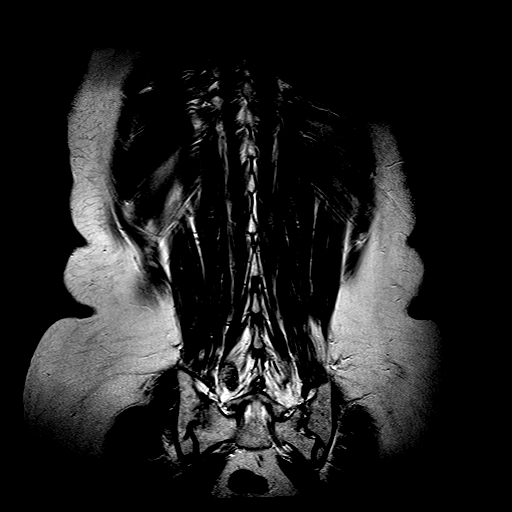
[im 5/18]
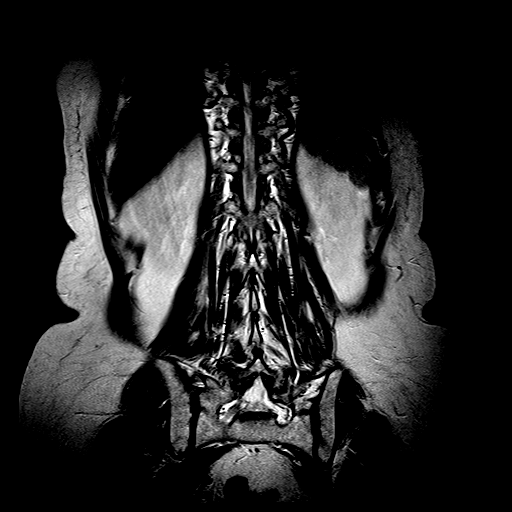
[im 8/18]
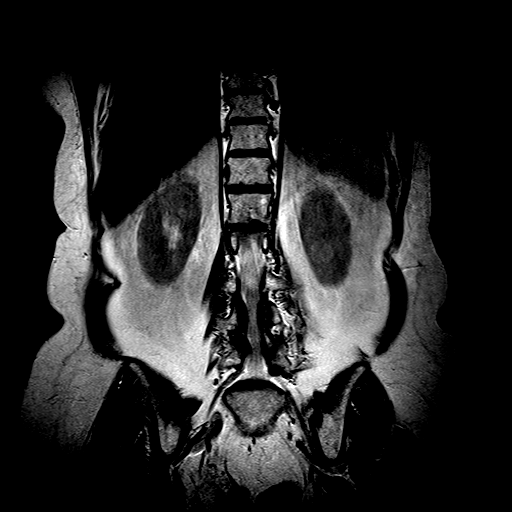
[im 10/18]
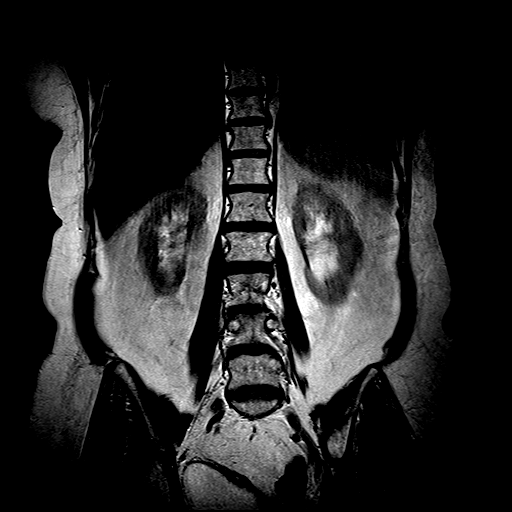
[im 13/18]
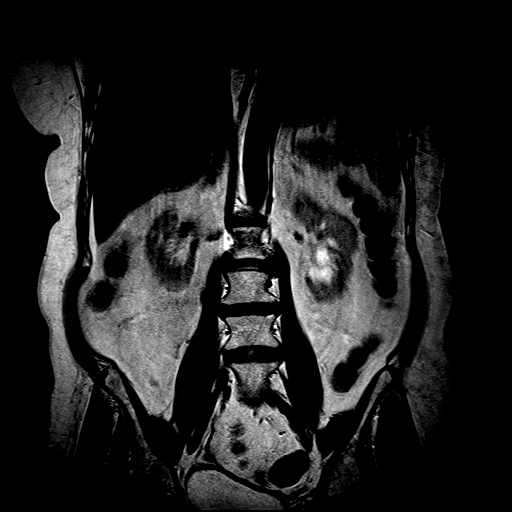
[im 15/18]
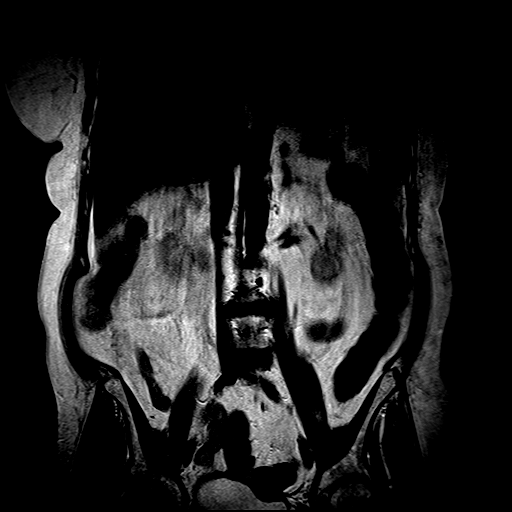
[im 18/18]
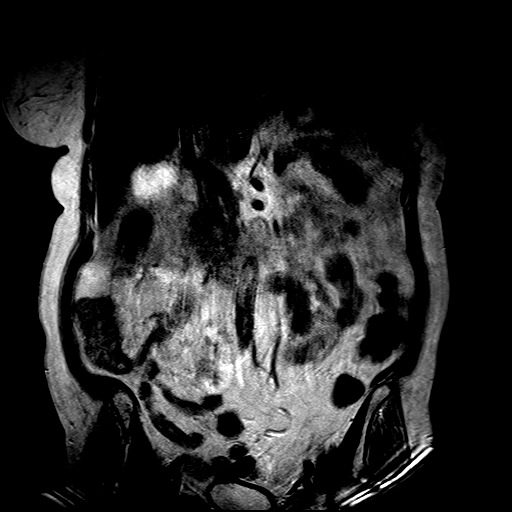

[Series 9: T2 · axial · 4.0mm · 0.52mm/px · z∈[-161,+79]mm · 11 of 26 slices shown (3 of 3)]
[im 1/26]
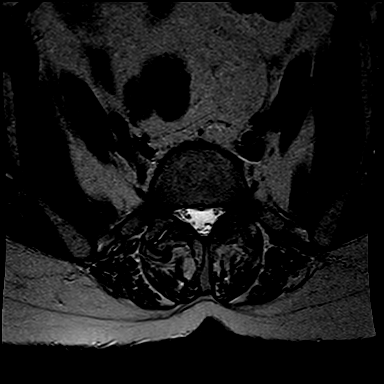
[im 3/26]
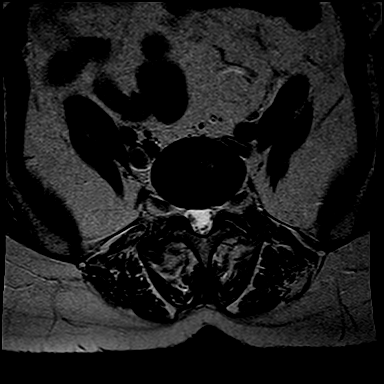
[im 6/26]
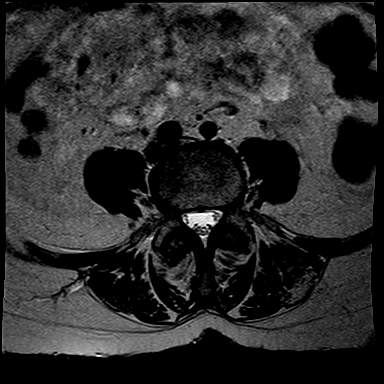
[im 8/26]
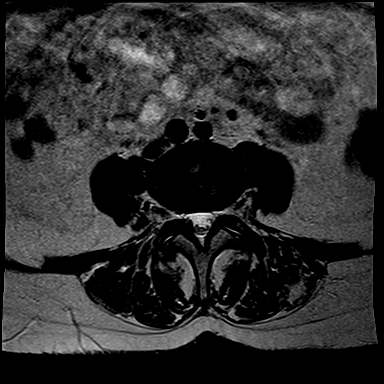
[im 11/26]
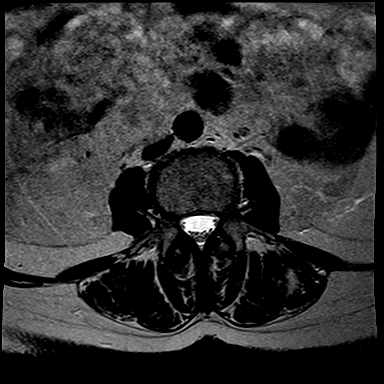
[im 13/26]
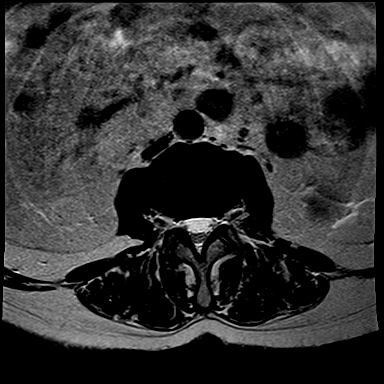
[im 16/26]
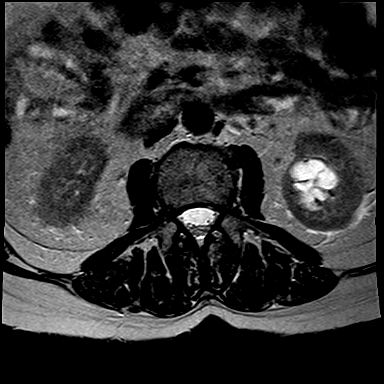
[im 18/26]
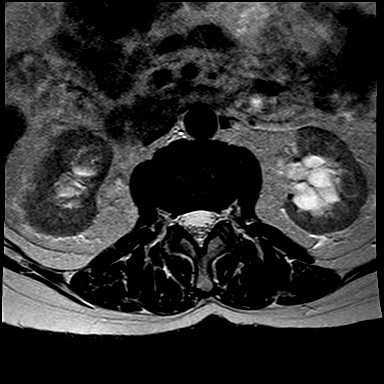
[im 21/26]
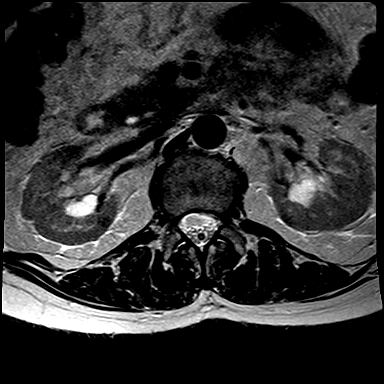
[im 23/26]
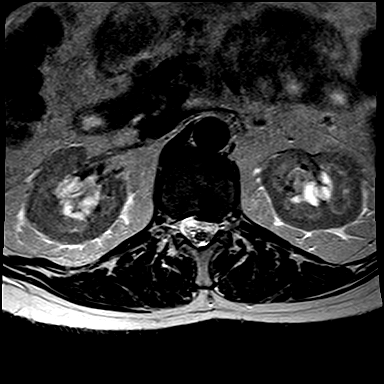
[im 26/26]
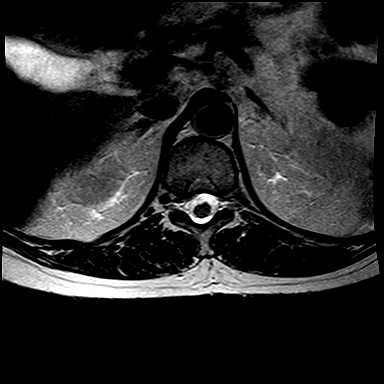

[Series 10: T1 · axial · 4.0mm · 0.52mm/px · 1 of 26 slices shown (2 of 2)]
[im 1/26]
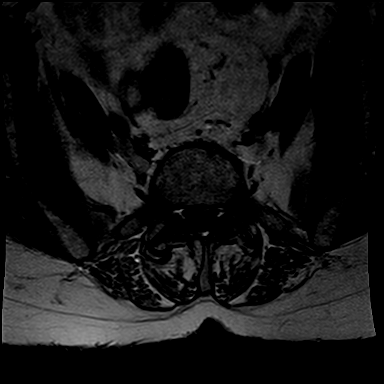

[32 of 48 positions shown; findings below may reference images not displayed]

FINDINGS: A transitional lumbosacral junction is again identified and the last complete intervertebral disc has been labeled S1-S2 for the purpose of this exam. Bone marrow signal intensity is normal. There is no acute fracture or subluxation. Distal spinal cord is normal in signal intensity and terminates normally at L1-2 disc space level. Spinal canal is congenitally narrow.

At L1-2 level, there is a small broad-based central and left paracentral disc bulge, mildly effacing the ventral thecal sac. There is no significant neural foraminal stenosis.

L2-3 and L3-4 levels are unremarkable.

At L4-5 level, there is mild-to-moderate left and mild right neural foraminal stenosis from facet arthropathy and bulging annulus.

At L5-S1 level, there is mild left neural foraminal stenosis from facet arthropathy.

S1-S2 level and paraspinal soft tissues are unremarkable.
IMPRESSION: 1. Transitional lumbosacral junction and the last complete intervertebral disc has been labeled S1-S2 for the purpose of this exam. 

2. No significant disc herniation or spinal stenosis at any level. 

3. Multilevel neural foraminal stenosis as detailed above.

## 2021-08-06 IMAGING — MR MRI CERVICAL SPINE WITHOUT CONTRAST
4 of 5 series · 23 of 48 positions shown · IV contrast (gadolinium)
Comparison: MRI dated 01/07/2021.

﻿EXAM:  [id number]   MRI CERVICAL SPINE WITHOUT CONTRAST
INDICATION: Neck and bilateral shoulder pain. History of remote neck surgery.
TECHNIQUE: Multiplanar multisequential MRI of the cervical spine was performed without gadolinium contrast.

[Series 5: T2 · sagittal · 3.0mm · 0.69mm/px · 8 of 15 slices shown (1 of 2)]
[im 1/15]
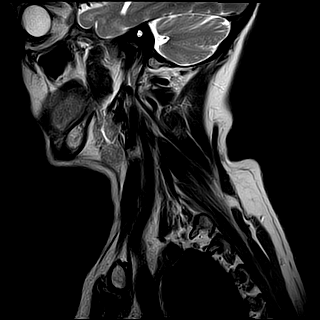
[im 3/15]
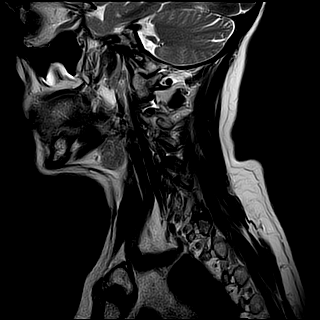
[im 5/15]
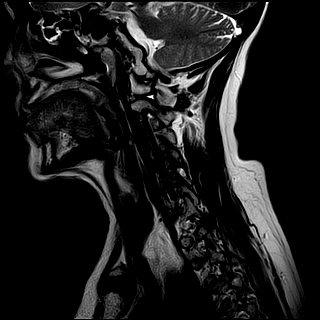
[im 7/15]
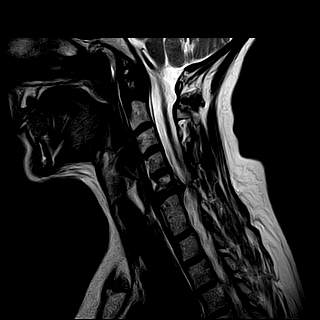
[im 9/15]
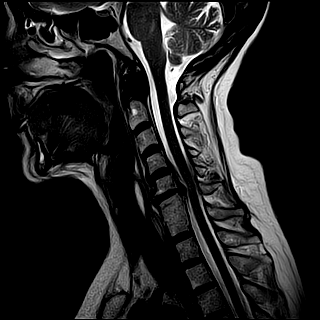
[im 11/15]
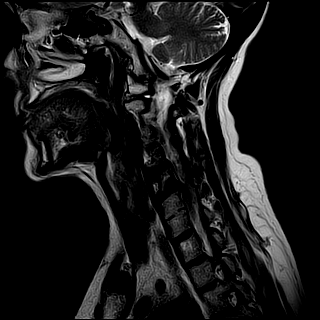
[im 13/15]
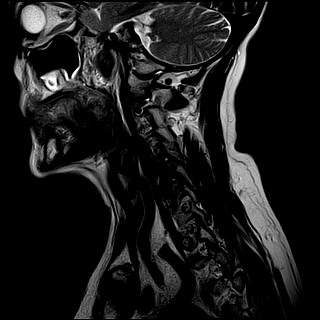
[im 15/15]
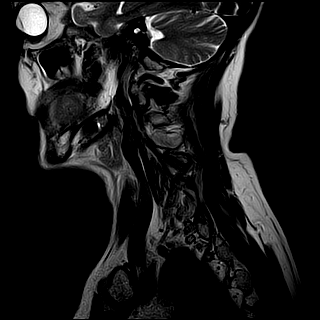

[Series 6: T1 · sagittal · 3.0mm · 0.47mm/px · 3 of 15 slices shown]
[im 2/15]
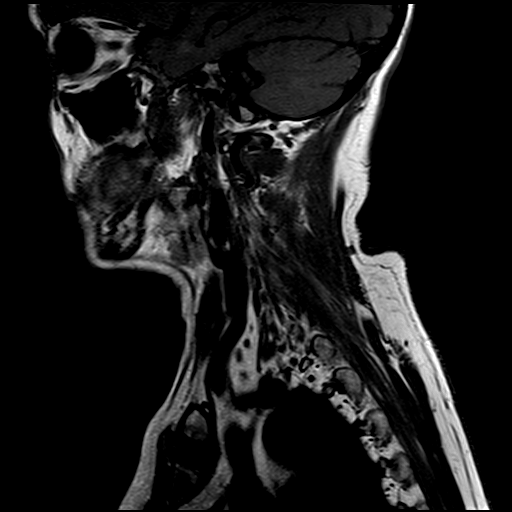
[im 8/15]
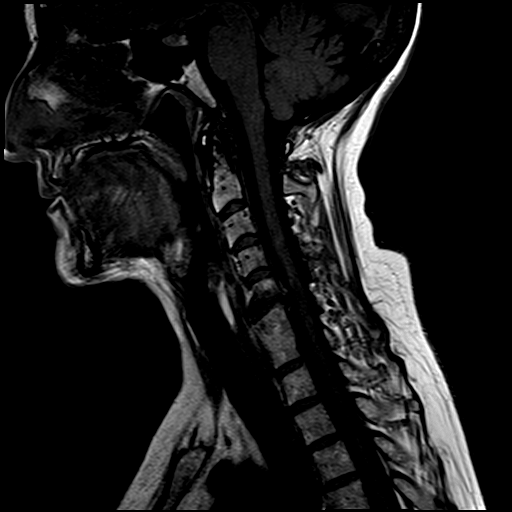
[im 13/15]
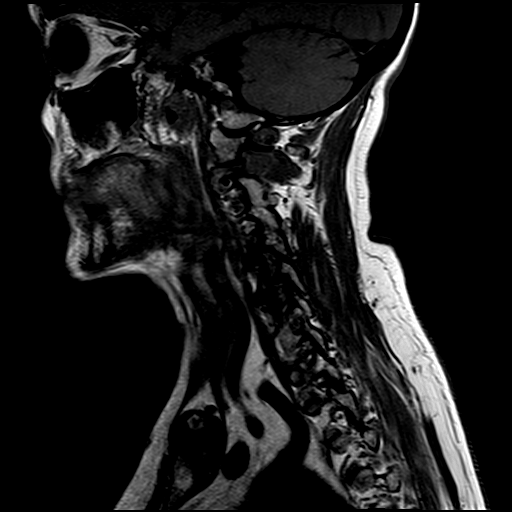

[Series 7: STIR · sagittal · 3.0mm · 0.43mm/px · 3 of 15 slices shown]
[im 2/15]
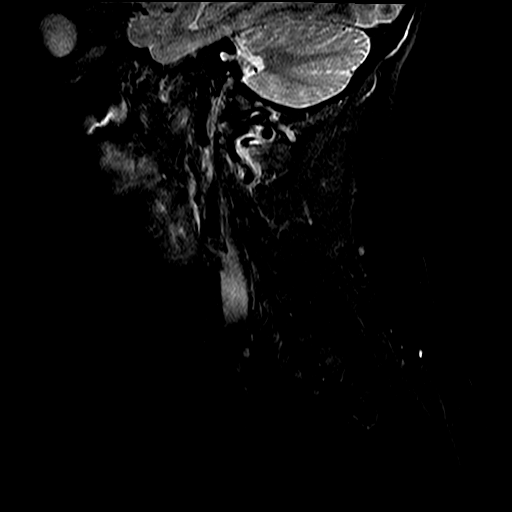
[im 8/15]
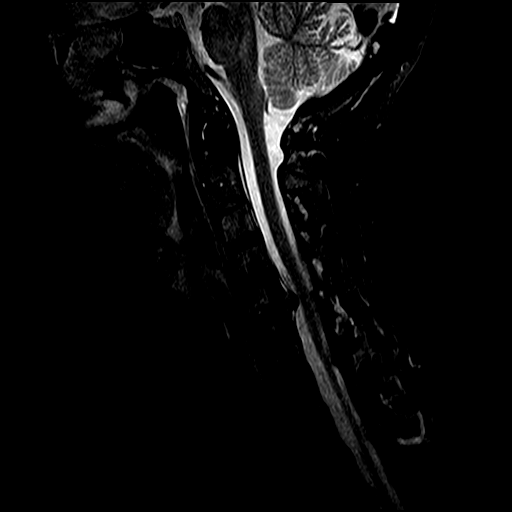
[im 13/15]
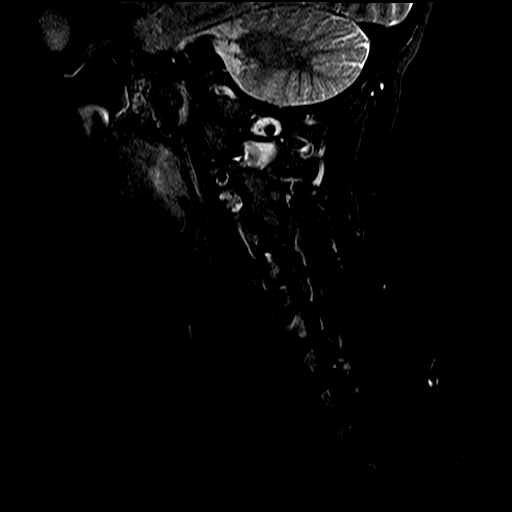

[Series 8: T2 · axial · 3.0mm · 0.39mm/px · z∈[-109,-8]mm · 9 of 18 slices shown (2 of 2)]
[im 1/18]
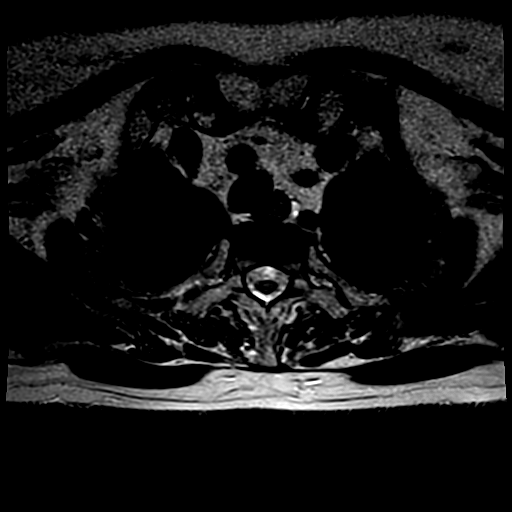
[im 2/18]
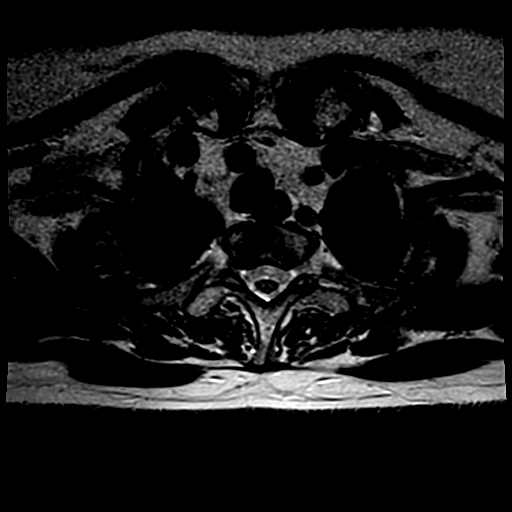
[im 4/18]
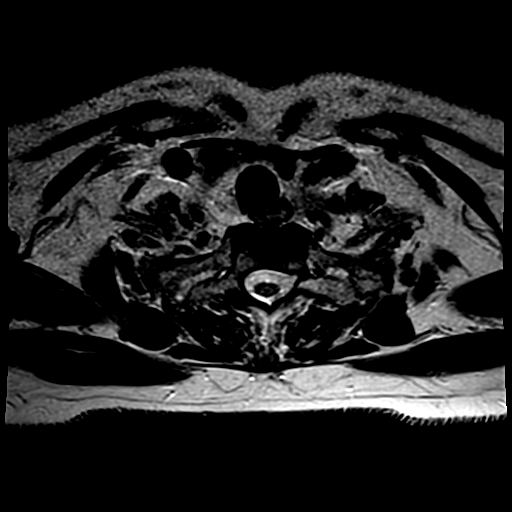
[im 6/18]
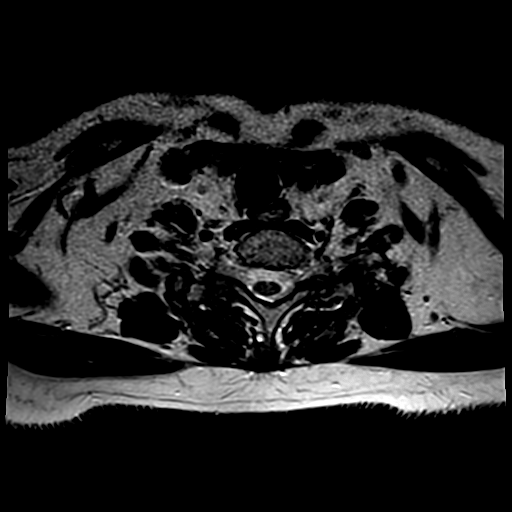
[im 7/18]
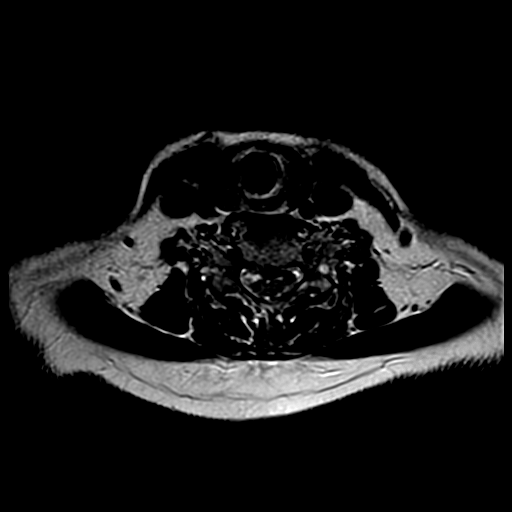
[im 9/18]
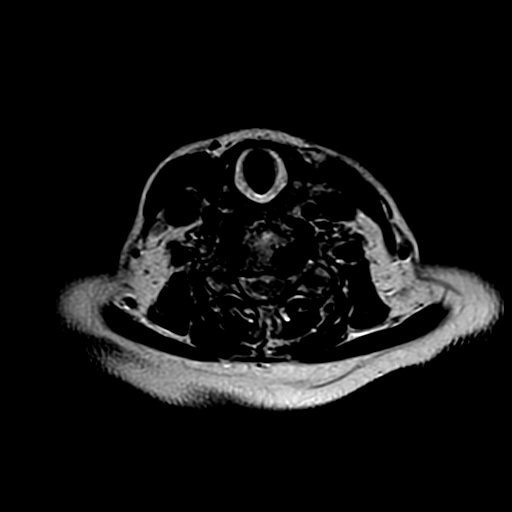
[im 11/18]
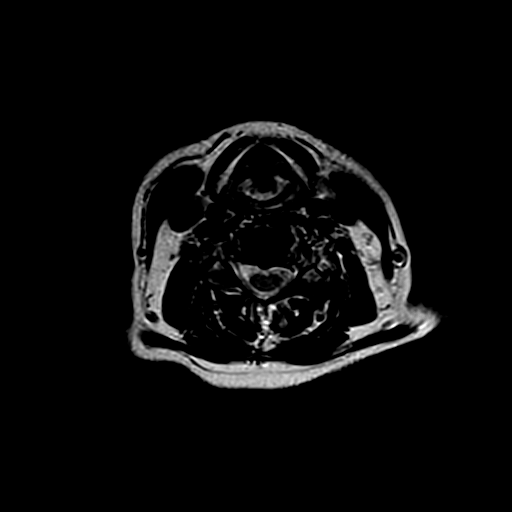
[im 12/18]
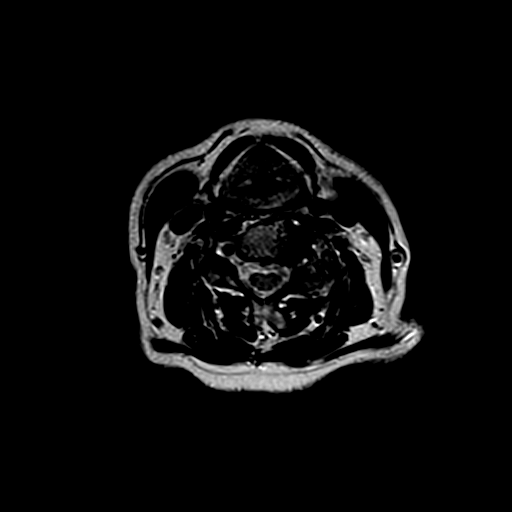
[im 16/18]
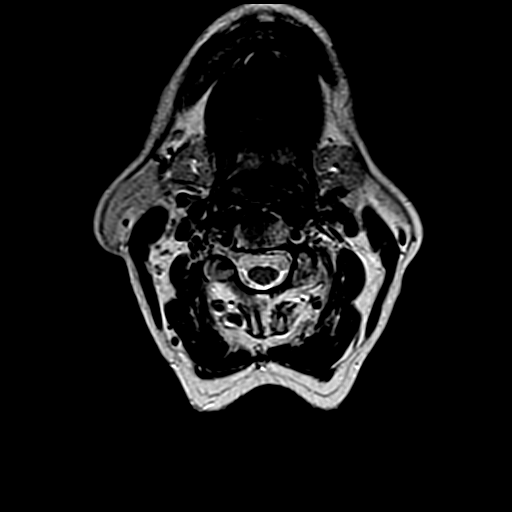

[23 of 48 positions shown; findings below may reference images not displayed]

FINDINGS: Bone marrow signal intensity is normal. Straightening of cervical lordosis is likely positional. No acute fracture or subluxation. Surgical fusion of C6 and C7 vertebral bodies is again identified. Visualized spinal cord is normal in signal intensity.

C2-3 level is unremarkable.

At C3-4 level, there is mild left neural foraminal stenosis from facet and uncovertebral joint hypertrophy.

At C4-5 level, there is minimal anterolisthesis of C4 on C5 vertebral body. There is a small broad-based central disc bulge partially effacing the ventral CSF. There is moderate to severe left neural foraminal stenosis from facet and uncovertebral joint hypertrophy.

At C5-6 level, a moderate broad-based central disc bulge is again noted resulting in moderate to severe spinal stenosis. There is moderate to severe bilateral neural foraminal stenosis from facet and uncovertebral joint hypertrophy.

C6-7, C7-T1 level and paraspinal soft tissues are unremarkable.
IMPRESSION: 1. Stable surgical fusion of C6 and C7 vertebral bodies. 

2. Stable moderate to severe spinal stenosis at C6-7 level from a moderate central disc bulge. 

3. Multilevel neural foraminal stenosis as detailed above.

## 2021-09-08 IMAGING — CT CT SOFT TISSUE NECK W/O & W CONTRAST
3 of 4 series · 16 of 33 positions shown, 19 images · IV contrast (CONTRAST)
Comparison: None available.

﻿EXAM:  CT SOFT TISSUE NECK W/O & W CONTRAST
INDICATION: Left-sided neck pain for a few weeks.
TECHNIQUE: Axial CT imaging of the neck was performed without and with 75 mL of Optiray 350.  Images were reviewed in multiple windows and projections. Exam was performed using 1 or more of the following dose reduction techniques: Automated exposure control, adjustment of the mA and/or kV according to patient size, or the use of iterative reconstruction technique.

[axial w · axial · 0.53mm/px · z∈[+89,+281]mm · 8 of 118 slices shown, 10 images]
[im 11/118  soft-tissue]
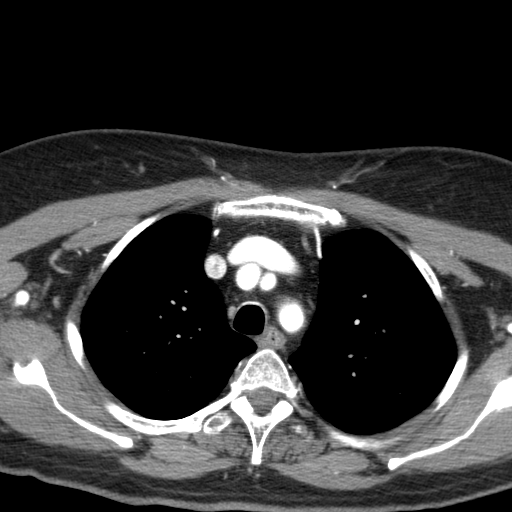
[im 11/118  bone]
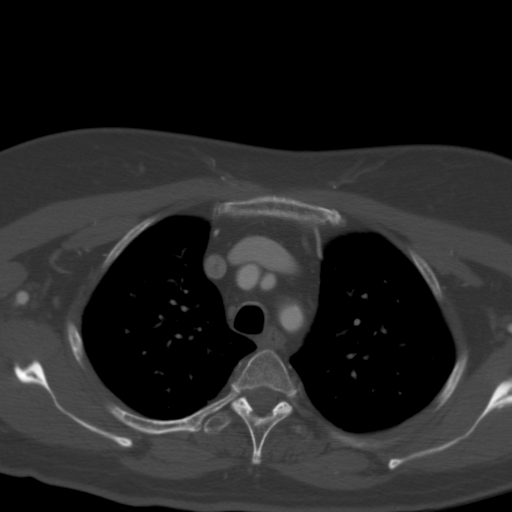
[im 22/118  bone]
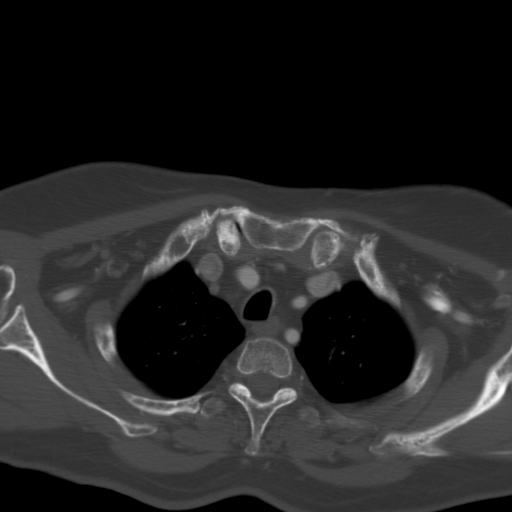
[im 43/118  bone]
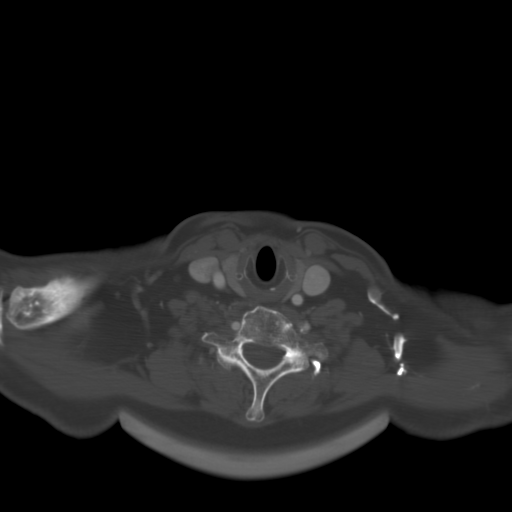
[im 54/118  bone]
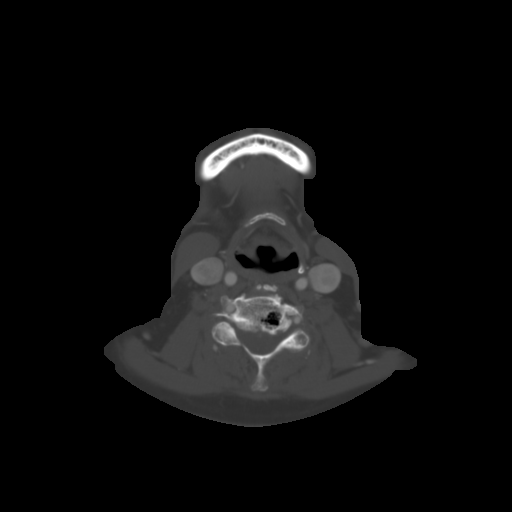
[im 64/118  soft-tissue]
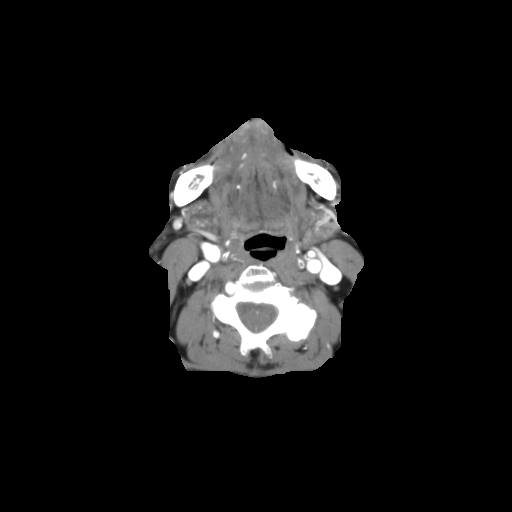
[im 64/118  bone]
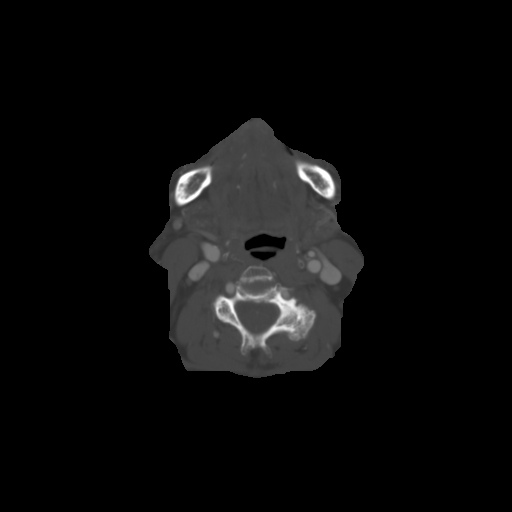
[im 75/118  bone]
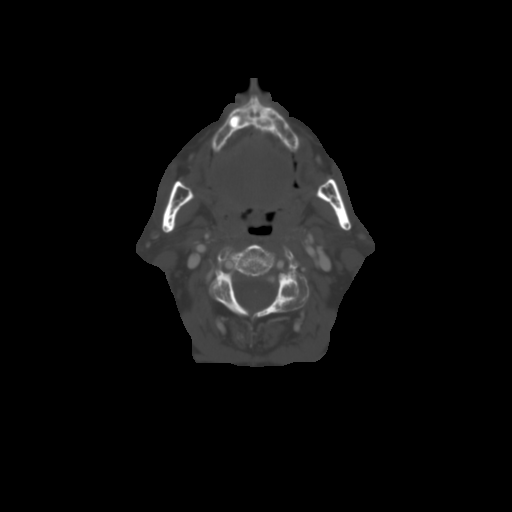
[im 96/118  bone]
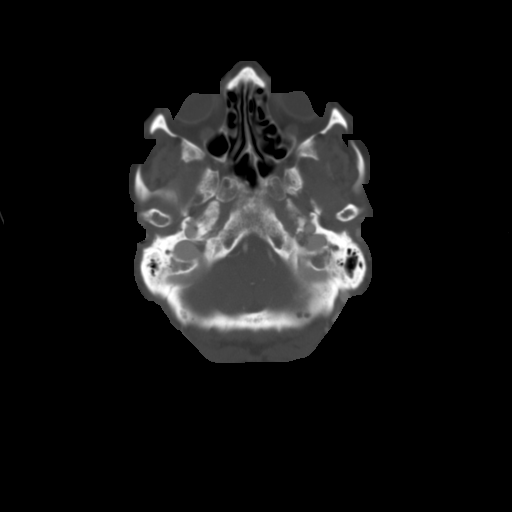
[im 107/118  bone]
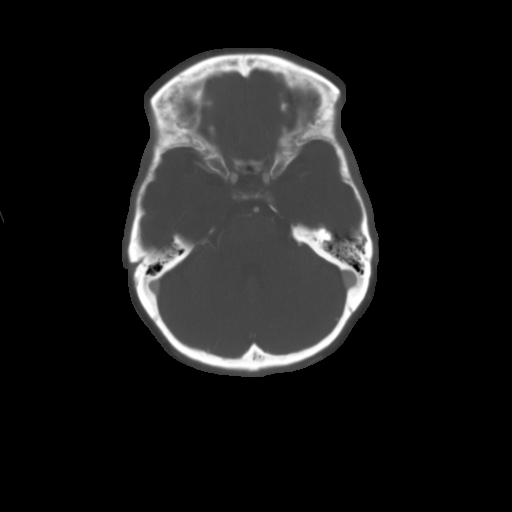

[cor · coronal · 0.53mm/px · 3 of 83 slices shown]
[im 17/83  bone]
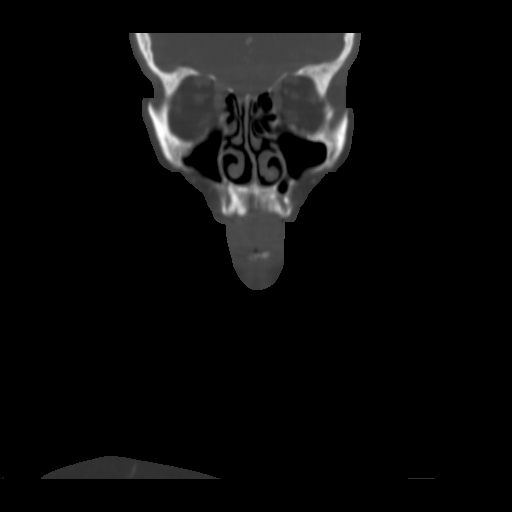
[im 33/83  bone]
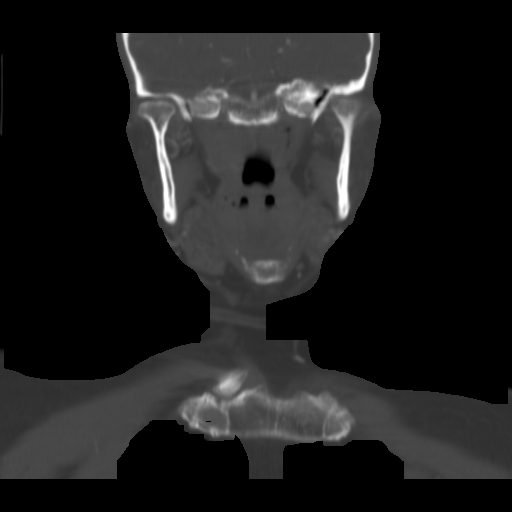
[im 50/83  bone]
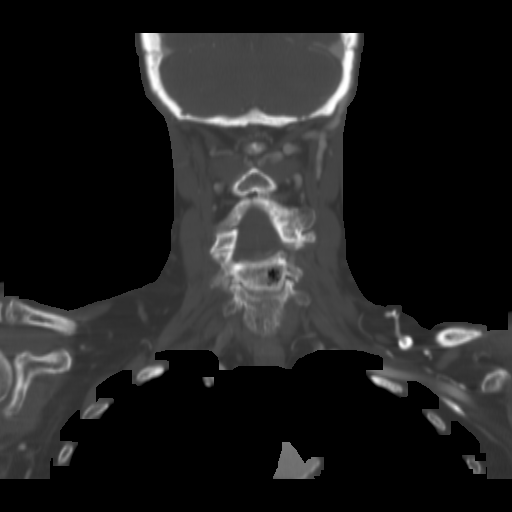

[sag · sagittal · 0.53mm/px · 5 of 90 slices shown, 6 images]
[im 30/90  bone]
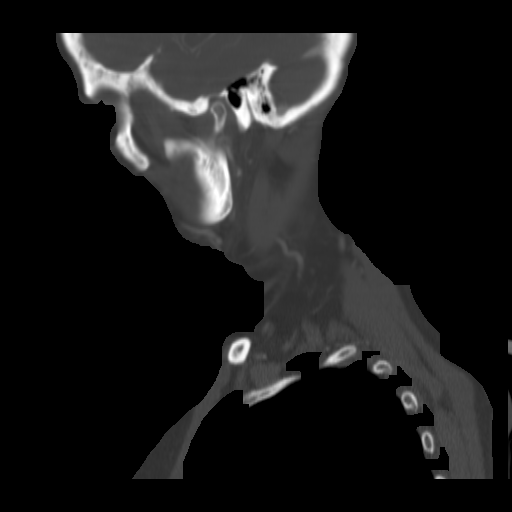
[im 38/90  bone]
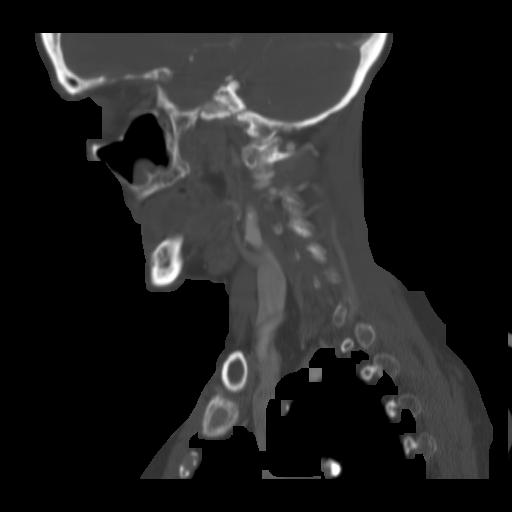
[im 45/90  soft-tissue]
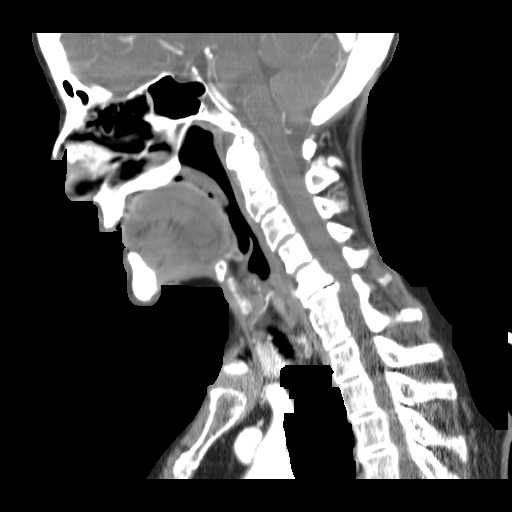
[im 45/90  bone]
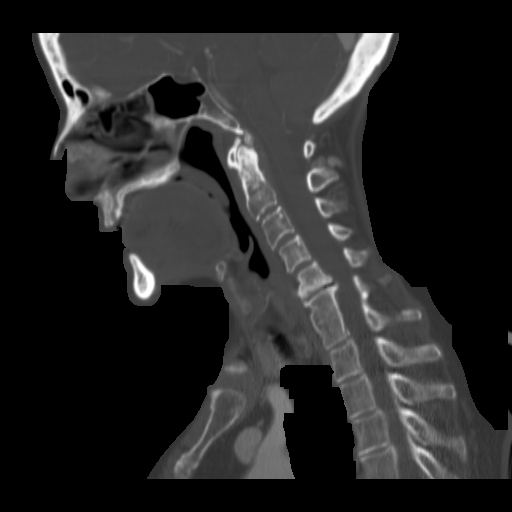
[im 52/90  bone]
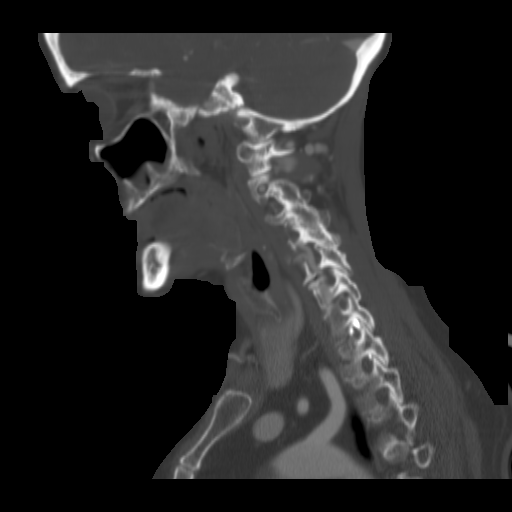
[im 60/90  bone]
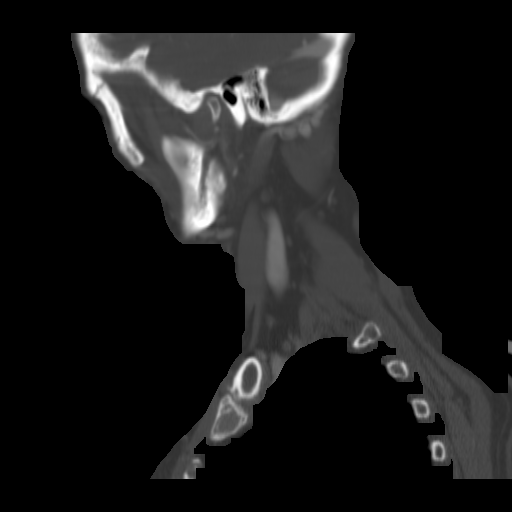

[16 of 33 positions shown; findings below may reference images not displayed]

FINDINGS: There is a single mildly enlarged left posterior cervical lymph node measuring 12.5 x 9 mm.  There is no anterior cervical adenopathy. The borders of the oro- and nasopharynx are well-defined and fairly symmetric without suspicious soft tissue mass. The parotid, submandibular and thyroid glands are unremarkable. The parapharyngeal spaces and other fascial compartments of the neck are also well maintained. There is no suspicious soft tissue infiltration. No vascular abnormality or abnormal enhancement is noted following intravenous contrast administration. Lung apices are clear.
IMPRESSION: A single mildly enlarged left posterior cervical lymph node as detailed above.  This is nonspecific and continued follow-up is recommended.

## 2021-09-30 DIAGNOSIS — T18128A Food in esophagus causing other injury, initial encounter: Secondary | ICD-10-CM | POA: Insufficient documentation

## 2021-12-16 ENCOUNTER — Other Ambulatory Visit (HOSPITAL_COMMUNITY): Payer: Self-pay

## 2021-12-16 DIAGNOSIS — Z1239 Encounter for other screening for malignant neoplasm of breast: Secondary | ICD-10-CM

## 2021-12-24 ENCOUNTER — Other Ambulatory Visit (HOSPITAL_COMMUNITY): Payer: Self-pay

## 2021-12-24 DIAGNOSIS — M810 Age-related osteoporosis without current pathological fracture: Secondary | ICD-10-CM

## 2022-03-17 ENCOUNTER — Inpatient Hospital Stay
Admission: RE | Admit: 2022-03-17 | Discharge: 2022-03-17 | Disposition: A | Discharge status: Home / Self Care | Payer: Medicare PPO | Source: Ambulatory Visit

## 2022-03-17 ENCOUNTER — Other Ambulatory Visit: Payer: Self-pay

## 2022-03-17 ENCOUNTER — Encounter (HOSPITAL_COMMUNITY): Payer: Self-pay

## 2022-03-17 ENCOUNTER — Inpatient Hospital Stay (HOSPITAL_COMMUNITY)
Admission: RE | Admit: 2022-03-17 | Discharge: 2022-03-17 | Disposition: A | Discharge status: Home / Self Care | Payer: Medicare PPO | Source: Ambulatory Visit

## 2022-03-17 DIAGNOSIS — Z1239 Encounter for other screening for malignant neoplasm of breast: Secondary | ICD-10-CM

## 2022-03-17 DIAGNOSIS — M810 Age-related osteoporosis without current pathological fracture: Secondary | ICD-10-CM

## 2022-03-17 DIAGNOSIS — Z1231 Encounter for screening mammogram for malignant neoplasm of breast: Secondary | ICD-10-CM | POA: Insufficient documentation

## 2022-04-13 ENCOUNTER — Inpatient Hospital Stay (HOSPITAL_COMMUNITY): Payer: Medicare PPO | Admitting: Internal Medicine

## 2022-04-13 ENCOUNTER — Encounter (HOSPITAL_BASED_OUTPATIENT_CLINIC_OR_DEPARTMENT_OTHER): Payer: Self-pay

## 2022-04-13 ENCOUNTER — Other Ambulatory Visit: Payer: Self-pay

## 2022-04-13 ENCOUNTER — Emergency Department (HOSPITAL_BASED_OUTPATIENT_CLINIC_OR_DEPARTMENT_OTHER): Payer: Medicare PPO

## 2022-04-13 ENCOUNTER — Inpatient Hospital Stay
Admission: EM | Admit: 2022-04-13 | Discharge: 2022-04-17 | DRG: 481 | Disposition: A | Discharge status: Home / Self Care | Payer: Medicare PPO | Attending: HOSPITALIST | Admitting: HOSPITALIST

## 2022-04-13 DIAGNOSIS — Z1623 Resistance to quinolones and fluoroquinolones: Secondary | ICD-10-CM | POA: Diagnosis present

## 2022-04-13 DIAGNOSIS — N179 Acute kidney failure, unspecified: Secondary | ICD-10-CM | POA: Diagnosis present

## 2022-04-13 DIAGNOSIS — W010XXA Fall on same level from slipping, tripping and stumbling without subsequent striking against object, initial encounter: Secondary | ICD-10-CM | POA: Diagnosis present

## 2022-04-13 DIAGNOSIS — S72009A Fracture of unspecified part of neck of unspecified femur, initial encounter for closed fracture: Secondary | ICD-10-CM

## 2022-04-13 DIAGNOSIS — N390 Urinary tract infection, site not specified: Secondary | ICD-10-CM | POA: Diagnosis present

## 2022-04-13 DIAGNOSIS — M25532 Pain in left wrist: Secondary | ICD-10-CM

## 2022-04-13 DIAGNOSIS — Z880 Allergy status to penicillin: Secondary | ICD-10-CM

## 2022-04-13 DIAGNOSIS — S72002A Fracture of unspecified part of neck of left femur, initial encounter for closed fracture: Secondary | ICD-10-CM | POA: Diagnosis present

## 2022-04-13 DIAGNOSIS — S72012A Unspecified intracapsular fracture of left femur, initial encounter for closed fracture: Principal | ICD-10-CM | POA: Diagnosis present

## 2022-04-13 DIAGNOSIS — W19XXXA Unspecified fall, initial encounter: Secondary | ICD-10-CM

## 2022-04-13 DIAGNOSIS — E1165 Type 2 diabetes mellitus with hyperglycemia: Secondary | ICD-10-CM | POA: Diagnosis present

## 2022-04-13 DIAGNOSIS — Y9301 Activity, walking, marching and hiking: Secondary | ICD-10-CM

## 2022-04-13 DIAGNOSIS — E119 Type 2 diabetes mellitus without complications: Secondary | ICD-10-CM

## 2022-04-13 DIAGNOSIS — Z881 Allergy status to other antibiotic agents status: Secondary | ICD-10-CM

## 2022-04-13 DIAGNOSIS — Z20822 Contact with and (suspected) exposure to covid-19: Secondary | ICD-10-CM | POA: Diagnosis present

## 2022-04-13 DIAGNOSIS — I10 Essential (primary) hypertension: Secondary | ICD-10-CM | POA: Diagnosis present

## 2022-04-13 DIAGNOSIS — E7800 Pure hypercholesterolemia, unspecified: Secondary | ICD-10-CM | POA: Diagnosis present

## 2022-04-13 DIAGNOSIS — Z7984 Long term (current) use of oral hypoglycemic drugs: Secondary | ICD-10-CM

## 2022-04-13 HISTORY — DX: Cyst of kidney, acquired: N28.1

## 2022-04-13 HISTORY — DX: Essential (primary) hypertension: I10

## 2022-04-13 HISTORY — DX: Type 2 diabetes mellitus without complications (CMS HCC): E11.9

## 2022-04-13 HISTORY — DX: Pure hypercholesterolemia, unspecified: E78.00

## 2022-04-13 HISTORY — DX: Gastro-esophageal reflux disease without esophagitis: K21.9

## 2022-04-13 HISTORY — DX: Unspecified perforation of tympanic membrane, unspecified ear: H72.90

## 2022-04-13 HISTORY — DX: Arthropathy, unspecified: M12.9

## 2022-04-13 HISTORY — DX: Type 2 diabetes mellitus without complications: E11.9

## 2022-04-13 LAB — URINALYSIS, MICROSCOPIC

## 2022-04-13 LAB — PT/INR
INR: 1.04 (ref 0.88–1.10)
PROTHROMBIN TIME: 12.1 seconds (ref 9.8–12.7)

## 2022-04-13 LAB — CBC WITH DIFF
BASOPHIL #: 0.03 10*3/uL (ref 0.00–0.30)
BASOPHIL %: 0 % (ref 0–3)
EOSINOPHIL #: 0.25 10*3/uL (ref 0.00–0.80)
EOSINOPHIL %: 2 % (ref 0–7)
HCT: 44.5 % (ref 37.0–47.0)
HGB: 15.1 g/dL (ref 12.5–16.0)
LYMPHOCYTE #: 2.15 10*3/uL (ref 1.10–5.00)
LYMPHOCYTE %: 18 % — ABNORMAL LOW (ref 25–45)
MCH: 30.4 pg (ref 27.0–32.0)
MCHC: 34 g/dL (ref 32.0–36.0)
MCV: 89.4 fL (ref 78.0–99.0)
MONOCYTE #: 0.79 10*3/uL (ref 0.00–1.30)
MONOCYTE %: 6 % (ref 0–12)
MPV: 8.1 fL (ref 7.4–10.4)
NEUTROPHIL #: 9.06 10*3/uL — ABNORMAL HIGH (ref 1.80–8.40)
NEUTROPHIL %: 74 % (ref 40–76)
PLATELETS: 201 10*3/uL (ref 140–440)
RBC: 4.98 10*6/uL (ref 4.20–5.40)
RDW: 14.9 % — ABNORMAL HIGH (ref 11.6–14.8)
WBC: 12.3 10*3/uL — ABNORMAL HIGH (ref 4.0–10.5)

## 2022-04-13 LAB — URINALYSIS, MACRO/MICRO
BILIRUBIN: NEGATIVE mg/dL
BLOOD: NEGATIVE mg/dL
GLUCOSE: >=1000 mg/dL — AB
KETONES: NEGATIVE mg/dL
NITRITE: POSITIVE — AB
PH: 6 (ref 4.6–8.0)
PROTEIN: NEGATIVE mg/dL
SPECIFIC GRAVITY: 1.01 (ref 1.003–1.035)
UROBILINOGEN: 0.2 mg/dL (ref 0.2–1.0)

## 2022-04-13 LAB — COMPREHENSIVE METABOLIC PANEL, NON-FASTING
ALBUMIN/GLOBULIN RATIO: 1.1 (ref 0.8–1.4)
ALBUMIN: 3.9 g/dL (ref 3.4–5.0)
ALKALINE PHOSPHATASE: 114 U/L (ref 46–116)
ALT (SGPT): 24 U/L (ref <=78)
ANION GAP: 11 mmol/L (ref 4–13)
AST (SGOT): 23 U/L (ref 15–37)
BILIRUBIN TOTAL: 0.5 mg/dL (ref 0.2–1.0)
BUN/CREA RATIO: 11
BUN: 15 mg/dL (ref 7–18)
CALCIUM, CORRECTED: 9.9 mg/dL
CALCIUM: 9.8 mg/dL (ref 8.5–10.1)
CHLORIDE: 100 mmol/L (ref 98–107)
CO2 TOTAL: 25 mmol/L (ref 21–32)
CREATININE: 1.4 mg/dL — ABNORMAL HIGH (ref 0.55–1.02)
ESTIMATED GFR: 41 mL/min/{1.73_m2} — ABNORMAL LOW (ref >59)
GLOBULIN: 3.6
GLUCOSE: 390 mg/dL — ABNORMAL HIGH (ref 74–106)
OSMOLALITY, CALCULATED: 289 mOsm/kg (ref 270–290)
POTASSIUM: 4.4 mmol/L (ref 3.5–5.1)
PROTEIN TOTAL: 7.5 g/dL (ref 6.4–8.2)
SODIUM: 136 mmol/L (ref 136–145)

## 2022-04-13 LAB — COVID-19, FLU A/B, RSV RAPID BY PCR
INFLUENZA VIRUS TYPE A: NOT DETECTED
INFLUENZA VIRUS TYPE B: NOT DETECTED
RESPIRATORY SYNCTIAL VIRUS (RSV): NOT DETECTED
SARS-CoV-2: NOT DETECTED

## 2022-04-13 LAB — PTT (PARTIAL THROMBOPLASTIN TIME): APTT: 39.4 seconds — ABNORMAL HIGH (ref 22.0–31.7)

## 2022-04-13 MED ORDER — HYDROCODONE 5 MG-ACETAMINOPHEN 325 MG TABLET
2.0000 | ORAL_TABLET | ORAL | Status: AC
Start: 2022-04-13 — End: 2022-04-13
  Administered 2022-04-13: 2 via ORAL

## 2022-04-13 MED ORDER — SODIUM CHLORIDE 0.9 % IV BOLUS
1000.0000 mL | INJECTION | Status: AC
Start: 2022-04-13 — End: 2022-04-13
  Administered 2022-04-13: 1000 mL via INTRAVENOUS
  Administered 2022-04-13: 0 mL via INTRAVENOUS

## 2022-04-13 MED ORDER — HYDROCODONE 5 MG-ACETAMINOPHEN 325 MG TABLET
ORAL_TABLET | ORAL | Status: AC
Start: 2022-04-13 — End: 2022-04-13
  Filled 2022-04-13: qty 2

## 2022-04-13 MED ORDER — LIDOCAINE HCL 10 MG/ML (1 %) INJECTION SOLUTION
1.0000 g | Freq: Once | INTRAMUSCULAR | Status: AC
Start: 2022-04-14 — End: 2022-04-13
  Administered 2022-04-13: 1 g via INTRAMUSCULAR

## 2022-04-13 MED ORDER — CEFTRIAXONE 1 GRAM SOLUTION FOR INJECTION
INTRAMUSCULAR | Status: AC
Start: 2022-04-13 — End: 2022-04-13
  Filled 2022-04-13: qty 10

## 2022-04-13 NOTE — ED Nurses Note (Signed)
Pt has pillow under her left knee stating that it relieves some of her pain. Family at bedside.

## 2022-04-13 NOTE — ED Nurses Note (Signed)
Pt sitting in bed. States that she fell approx 2 hours prior. States that she fell on her left side. She states that she thought she was ok and went to bingo. She states that the pain got too bad so she came in. She is complaining of pain from her left hip to her mid thigh. She states that the pain is worse when its touched or she puts pressure on it. Pt rates her pain at a 10/10.

## 2022-04-13 NOTE — ED Provider Notes (Signed)
[facility], [facility] Emergency Department  ED Primary Provider Note  History of Present Illness   Chief Complaint   Patient presents with    Brandy Flores is a 66 y.o. female who had concerns including Fall.  Arrival: The patient arrived by Car    Patient 66 year old female to the emergency department status post fall.  Patient states she was walking trip and landed on her left hip leg and wrist.  Patient now complaining of low back, left hip and left wrist pain.  Patient states she did not hit head and did lose consciousness.  Patient currently states pain is an 8/10 as worse with walking or pressure to the left hip.  Patient does have history of osteoarthritis and takes Norco 10s daily.  Patient states she is not taken her Norco 10 today.    Review of Systems   Pertinent positive and negative ROS as per HPI.  Historical Data   History Reviewed This Encounter: Medical History  Surgical History  Family History  Social History    Physical Exam   ED Triage Vitals [04/13/22 1959]   BP (Non-Invasive) 133/73   Heart Rate 94   Respiratory Rate 18   Temperature 36.7 C (98 F)   SpO2 95 %   Weight 68 kg (150 lb)   Height 1.626 m ('5\' 4"' )     Physical Exam  Vitals and nursing note reviewed.   Constitutional:       General: She is not in acute distress.     Appearance: She is well-developed.   HENT:      Head: Normocephalic and atraumatic.   Eyes:      Conjunctiva/sclera: Conjunctivae normal.   Cardiovascular:      Rate and Rhythm: Normal rate and regular rhythm.      Heart sounds: No murmur heard.  Pulmonary:      Effort: Pulmonary effort is normal. No respiratory distress.      Breath sounds: Normal breath sounds.   Abdominal:      Palpations: Abdomen is soft.      Tenderness: There is no abdominal tenderness.   Musculoskeletal:         General: No swelling.      Cervical back: Neck supple.   Skin:     General: Skin is warm and dry.      Capillary Refill: Capillary refill  takes less than 2 seconds.   Neurological:      Mental Status: She is alert.   Psychiatric:         Mood and Affect: Mood normal.     Patient Data     Labs Ordered/Reviewed   COMPREHENSIVE METABOLIC PANEL, NON-FASTING - Abnormal; Notable for the following components:       Result Value    CREATININE 1.40 (*)     ESTIMATED GFR 41 (*)     GLUCOSE 390 (*)     All other components within normal limits    Narrative:     Estimated Glomerular Filtration Rate (eGFR) is calculated using the CKD-EPI (2021) equation, intended for patients 1 years of age and older. If gender is not documented or "unknown", there will be no eGFR calculation.   PTT (PARTIAL THROMBOPLASTIN TIME) - Abnormal; Notable for the following components:    APTT 39.4 (*)     All other components within normal limits   CBC WITH DIFF - Abnormal; Notable for the following components:  WBC 12.3 (*)     RDW 14.9 (*)     LYMPHOCYTE % 18 (*)     NEUTROPHIL # 9.06 (*)     All other components within normal limits   URINALYSIS, MACRO/MICRO - Abnormal; Notable for the following components:    COLOR Light Yellow (*)     APPEARANCE Hazy (*)     LEUKOCYTES Trace (*)     NITRITE Positive (*)     GLUCOSE >=1000 (*)     All other components within normal limits   URINALYSIS, MICROSCOPIC - Abnormal; Notable for the following components:    BACTERIA Many (*)     MUCOUS Occasional (*)     WBCS 20-50 (*)     SQUAMOUS EPITHELIAL Several (*)     All other components within normal limits   PT/INR - Normal   URINE CULTURE,ROUTINE   CBC/DIFF    Narrative:     The following orders were created for panel order CBC/DIFF.  Procedure                               Abnormality         Status                     ---------                               -----------         ------                     CBC WITH OIZT[[id number]]                Abnormal            Final result                 Please view results for these tests on the individual orders.   URINALYSIS WITH REFLEX MICROSCOPIC AND  CULTURE IF POSITIVE    Narrative:     The following orders were created for panel order URINALYSIS WITH REFLEX MICROSCOPIC AND CULTURE IF POSITIVE.  Procedure                               Abnormality         Status                     ---------                               -----------         ------                     URINALYSIS, JASNK/NLZJQ[[id number]]      Abnormal            Final result               URINALYSIS, MICROSCOPIC[[id number]]      Abnormal            Final result                 Please view results for these tests on the individual orders.   COVID-19, FLU A/B, RSV RAPID BY PCR  XR HIP LEFT W PELVIS 2-3 VIEWS   Final Result by Edi, Radresults In (09/12 2050)   Acute left femoral subcapital neck fracture.                Radiologist location ID: [id number]         XR LUMBAR SPINE AP AND LAT   Final Result by Edi, Radresults In (09/12 2050)   MILD DEGENERATIVE CHANGES OF THE LUMBAR SPINE. NO ACUTE FINDINGS.               Radiologist location ID: [id number]         XR FEMUR LEFT   Final Result by Edi, Radresults In (09/12 2051)   Acute left femoral subcapital neck fracture.            Radiologist location ID: [id number]         XR HAND LEFT 2 VIEW   Final Result by Edi, Radresults In (09/12 2055)   DEGENERATIVE OSTEOARTHROSIS. NO ACUTE FINDINGS.                Radiologist location ID: [place] Decision Making        Medical Decision Making  Patient 66 year old female to the emergency department status post fall left hip.  Patient physical examination no obvious deformities, ecchymosis open skin or leg shortening.  Differential diagnosis include but are not limited to contusion versus fracture.  X-ray ordered for evaluation showing left femoral subcapital neck fracture.  Lab work shows stable hemoglobin at 15.1 glucose elevated at 360 with history of diabetes.  Patient's creatinine all so elevated at 1.3.  Patient given 1 L of normal saline in the emergency department.   Discussed findings with Dr. Lilia Pro, orthopedic surgeon and patient be admitted with he him as a consult to the hospitalist team.  Discussed with hospitalist team and patient admitted to hospitalist team with consult to Dr. Lilia Pro to [facility].    Amount and/or Complexity of Data Reviewed  Labs: ordered.  Radiology: ordered.  ECG/medicine tests: ordered.    Risk  Prescription drug management.  Decision regarding hospitalization.             Medications Administered in the ED   NS bolus infusion 1,000 mL (has no administration in time range)   HYDROcodone-acetaminophen (NORCO) 5-325 mg per tablet (2 Tablets Oral Given 04/13/22 2051)     Clinical Impression   Hip fracture (CMS HCC) (Primary)   Hypertension, unspecified type   Type 2 diabetes mellitus (CMS HCC)       Disposition: Admitted

## 2022-04-13 NOTE — ED Nurses Note (Signed)
Called report to [facility]. [facility] in room at this time to transport patient. Pt leaving with belongings and paperwork. Pt stable at transfer.

## 2022-04-13 NOTE — ED Nurses Note (Signed)
Pt rates her pain at a 10/10 when touched. States that she takes Norco at home. Family at bedside. Resp even and non labored. Equal chest rise and fall.

## 2022-04-13 NOTE — ED Triage Notes (Addendum)
Pt tripped over a chair, fell on her left side about 2 hours ago. Pt attempted to catch herself unsuccessful, small bruise noted to hand area. Pt states she is unable to walk due to pain in left hip. Denies LOC.

## 2022-04-13 NOTE — Nurses Notes (Signed)
2327: notified Candy Sledge, NP at this time that patient is here from [facility] ER

## 2022-04-14 ENCOUNTER — Encounter (HOSPITAL_COMMUNITY)
Admission: EM | Disposition: A | Discharge status: Home / Self Care | Payer: Self-pay | Source: Home / Self Care | Attending: HOSPITALIST

## 2022-04-14 ENCOUNTER — Inpatient Hospital Stay (HOSPITAL_COMMUNITY): Payer: Medicare PPO | Admitting: Anesthesiology

## 2022-04-14 ENCOUNTER — Inpatient Hospital Stay (HOSPITAL_COMMUNITY): Payer: Medicare PPO

## 2022-04-14 ENCOUNTER — Encounter (HOSPITAL_COMMUNITY): Payer: Self-pay | Admitting: Internal Medicine

## 2022-04-14 DIAGNOSIS — S72012A Unspecified intracapsular fracture of left femur, initial encounter for closed fracture: Secondary | ICD-10-CM

## 2022-04-14 DIAGNOSIS — S72031A Displaced midcervical fracture of right femur, initial encounter for closed fracture: Secondary | ICD-10-CM

## 2022-04-14 DIAGNOSIS — E1165 Type 2 diabetes mellitus with hyperglycemia: Secondary | ICD-10-CM

## 2022-04-14 DIAGNOSIS — W07XXXA Fall from chair, initial encounter: Secondary | ICD-10-CM

## 2022-04-14 DIAGNOSIS — N179 Acute kidney failure, unspecified: Secondary | ICD-10-CM

## 2022-04-14 LAB — ECG 12 LEAD
Atrial Rate: 88 {beats}/min
Calculated P Axis: 61 degrees
Calculated R Axis: 15 degrees
Calculated T Axis: 19 degrees
PR Interval: 160 ms
QRS Duration: 74 ms
QT Interval: 348 ms
QTC Calculation: 421 ms
Ventricular rate: 88 {beats}/min

## 2022-04-14 LAB — POC BLOOD GLUCOSE (RESULTS)
GLUCOSE, POC: 210 mg/dl (ref 50–500)
GLUCOSE, POC: 188 mg/dl (ref 50–500)
GLUCOSE, POC: 201 mg/dl (ref 50–500)
GLUCOSE, POC: 220 mg/dl (ref 50–500)

## 2022-04-14 SURGERY — CLOSED REDUCTION PERCUTANEOUS PINNING FEMUR
Anesthesia: General | Site: Hip | Laterality: Left | Wound class: Clean Wound: Uninfected operative wounds in which no inflammation occurred

## 2022-04-14 MED ORDER — LISINOPRIL 5 MG TABLET
2.5000 mg | ORAL_TABLET | Freq: Every day | ORAL | Status: DC
Start: 2022-04-14 — End: 2022-04-17
  Administered 2022-04-14 – 2022-04-17 (×4): 0 mg via ORAL

## 2022-04-14 MED ORDER — SODIUM CHLORIDE 0.9 % INTRAVENOUS SOLUTION
INTRAVENOUS | Status: DC
Start: 2022-04-14 — End: 2022-04-14

## 2022-04-14 MED ORDER — HYDROMORPHONE 2 MG/ML INJECTION WRAPPER
0.2000 mg | INJECTION | INTRAMUSCULAR | Status: AC
Start: 2022-04-14 — End: 2022-04-14
  Administered 2022-04-14: 0.2 mg via INTRAVENOUS
  Filled 2022-04-14: qty 1

## 2022-04-14 MED ORDER — OXYCODONE-ACETAMINOPHEN 5 MG-325 MG TABLET
2.0000 | ORAL_TABLET | ORAL | Status: DC | PRN
Start: 2022-04-14 — End: 2022-04-17
  Administered 2022-04-17: 2 via ORAL
  Filled 2022-04-14: qty 2

## 2022-04-14 MED ORDER — SODIUM CHLORIDE 0.9 % (FLUSH) INJECTION SYRINGE
3.0000 mL | INJECTION | Freq: Three times a day (TID) | INTRAMUSCULAR | Status: DC
Start: 2022-04-14 — End: 2022-04-14

## 2022-04-14 MED ORDER — ACETAMINOPHEN 325 MG TABLET
650.0000 mg | ORAL_TABLET | ORAL | Status: DC | PRN
Start: 2022-04-14 — End: 2022-04-17

## 2022-04-14 MED ORDER — ONDANSETRON HCL (PF) 4 MG/2 ML INJECTION SOLUTION
4.0000 mg | Freq: Four times a day (QID) | INTRAMUSCULAR | Status: DC | PRN
Start: 2022-04-14 — End: 2022-04-14
  Administered 2022-04-14: 4 mg via INTRAVENOUS
  Filled 2022-04-14: qty 2

## 2022-04-14 MED ORDER — SODIUM CHLORIDE 0.9 % INTRAVENOUS SOLUTION
INTRAVENOUS | Status: DC
Start: 2022-04-14 — End: 2022-04-14
  Administered 2022-04-14: 0 mL via INTRAVENOUS

## 2022-04-14 MED ORDER — GLUCAGON 1 MG/ML SOLUTION FOR INJECTION
1.0000 mg | INTRAMUSCULAR | Status: DC | PRN
Start: 2022-04-14 — End: 2022-04-17

## 2022-04-14 MED ORDER — FAMOTIDINE (PF) 20 MG/2 ML INTRAVENOUS SOLUTION
INTRAVENOUS | Status: AC
Start: 2022-04-14 — End: 2022-04-14
  Filled 2022-04-14: qty 2

## 2022-04-14 MED ORDER — LACTATED RINGERS INTRAVENOUS SOLUTION
INTRAVENOUS | Status: DC
Start: 2022-04-14 — End: 2022-04-14

## 2022-04-14 MED ORDER — PANTOPRAZOLE 40 MG TABLET,DELAYED RELEASE
40.0000 mg | DELAYED_RELEASE_TABLET | Freq: Every day | ORAL | Status: DC
Start: 2022-04-14 — End: 2022-04-17
  Administered 2022-04-14: 0 mg via ORAL
  Administered 2022-04-15 – 2022-04-17 (×3): 40 mg via ORAL
  Filled 2022-04-14 (×3): qty 1

## 2022-04-14 MED ORDER — HYDROMORPHONE 2 MG/ML INJECTION WRAPPER
0.2000 mg | INJECTION | INTRAMUSCULAR | Status: DC | PRN
Start: 2022-04-14 — End: 2022-04-14
  Administered 2022-04-14: 0.2 mg via INTRAVENOUS
  Filled 2022-04-14: qty 1

## 2022-04-14 MED ORDER — MORPHINE 2 MG/ML INJECTION WRAPPER
2.0000 mg | INJECTION | INTRAMUSCULAR | Status: DC | PRN
Start: 2022-04-14 — End: 2022-04-17

## 2022-04-14 MED ORDER — DEXTROSE 50 % IN WATER (D50W) INTRAVENOUS SYRINGE
12.5000 g | INJECTION | INTRAVENOUS | Status: DC | PRN
Start: 2022-04-14 — End: 2022-04-17

## 2022-04-14 MED ORDER — FERROUS SULFATE 324 MG (65 MG IRON) TABLET,DELAYED RELEASE
324.0000 mg | DELAYED_RELEASE_TABLET | Freq: Every morning | ORAL | Status: DC
Start: 2022-04-15 — End: 2022-04-14

## 2022-04-14 MED ORDER — FENTANYL (PF) 50 MCG/ML INJECTION SOLUTION
INTRAMUSCULAR | Status: AC
Start: 2022-04-14 — End: 2022-04-14
  Filled 2022-04-14: qty 2

## 2022-04-14 MED ORDER — ASPIRIN 81 MG CHEWABLE TABLET
81.0000 mg | CHEWABLE_TABLET | Freq: Two times a day (BID) | ORAL | Status: DC
Start: 2022-04-15 — End: 2022-04-14

## 2022-04-14 MED ORDER — IPRATROPIUM 0.5 MG-ALBUTEROL 3 MG (2.5 MG BASE)/3 ML NEBULIZATION SOLN
3.0000 mL | INHALATION_SOLUTION | Freq: Once | RESPIRATORY_TRACT | Status: DC | PRN
Start: 2022-04-14 — End: 2022-04-14

## 2022-04-14 MED ORDER — DEXMEDETOMIDINE 100 MCG/ML INTRAVENOUS SOLUTION
Freq: Once | INTRAVENOUS | Status: DC | PRN
Start: 2022-04-14 — End: 2022-04-14
  Administered 2022-04-14: 4 ug via INTRAVENOUS
  Administered 2022-04-14: 6 ug via INTRAVENOUS

## 2022-04-14 MED ORDER — SODIUM CHLORIDE 0.9 % INTRAVENOUS SOLUTION
INTRAVENOUS | Status: AC
Start: 2022-04-14 — End: 2022-04-15

## 2022-04-14 MED ORDER — LACTATED RINGERS INTRAVENOUS SOLUTION
INTRAVENOUS | Status: DC
Start: 2022-04-14 — End: 2022-04-17

## 2022-04-14 MED ORDER — SODIUM CHLORIDE 0.9 % (FLUSH) INJECTION SYRINGE
3.0000 mL | INJECTION | INTRAMUSCULAR | Status: DC | PRN
Start: 2022-04-14 — End: 2022-04-14

## 2022-04-14 MED ORDER — FAMOTIDINE (PF) 20 MG/2 ML INTRAVENOUS SOLUTION
20.0000 mg | Freq: Once | INTRAVENOUS | Status: AC
Start: 2022-04-14 — End: 2022-04-14
  Administered 2022-04-14: 20 mg via INTRAVENOUS

## 2022-04-14 MED ORDER — OXYCODONE-ACETAMINOPHEN 5 MG-325 MG TABLET
1.0000 | ORAL_TABLET | ORAL | Status: DC | PRN
Start: 2022-04-14 — End: 2022-04-17

## 2022-04-14 MED ORDER — OXYCODONE-ACETAMINOPHEN 5 MG-325 MG TABLET
1.0000 | ORAL_TABLET | ORAL | Status: DC | PRN
Start: 2022-04-14 — End: 2022-04-14

## 2022-04-14 MED ORDER — HYDROMORPHONE 2 MG/ML INJECTION WRAPPER
0.2000 mg | INJECTION | INTRAMUSCULAR | Status: DC | PRN
Start: 2022-04-14 — End: 2022-04-17

## 2022-04-14 MED ORDER — PHENYLEPHRINE 10 MG/ML INJECTION SOLUTION
Freq: Once | INTRAMUSCULAR | Status: DC | PRN
Start: 2022-04-14 — End: 2022-04-14
  Administered 2022-04-14: 100 ug via INTRAVENOUS

## 2022-04-14 MED ORDER — MULTIVITAMIN-FERROUS FUMARATE-FOLIC ACID 18 MG-400 MCG TABLET
1.0000 | ORAL_TABLET | Freq: Every day | ORAL | Status: DC
Start: 2022-04-14 — End: 2022-04-14

## 2022-04-14 MED ORDER — CHOLECALCIFEROL (VITAMIN D3) 25 MCG (1,000 UNIT) TABLET
1000.0000 [IU] | ORAL_TABLET | Freq: Every day | ORAL | Status: DC
Start: 2022-04-14 — End: 2022-04-14

## 2022-04-14 MED ORDER — DEXTROSE 50 % IN WATER (D50W) INTRAVENOUS SYRINGE
25.0000 g | INJECTION | INTRAVENOUS | Status: DC | PRN
Start: 2022-04-14 — End: 2022-04-17

## 2022-04-14 MED ORDER — CHOLECALCIFEROL (VITAMIN D3) 25 MCG (1,000 UNIT) TABLET
1000.0000 [IU] | ORAL_TABLET | Freq: Every day | ORAL | Status: DC
Start: 2022-04-14 — End: 2022-04-17
  Administered 2022-04-14 – 2022-04-17 (×4): 1000 [IU] via ORAL
  Filled 2022-04-14 (×4): qty 1

## 2022-04-14 MED ORDER — NALOXONE 0.4 MG/ML INJECTION SOLUTION
0.4000 mg | INTRAMUSCULAR | Status: DC | PRN
Start: 2022-04-14 — End: 2022-04-14

## 2022-04-14 MED ORDER — ACETAMINOPHEN 325 MG TABLET
975.0000 mg | ORAL_TABLET | Freq: Four times a day (QID) | ORAL | Status: AC
Start: 2022-04-14 — End: 2022-04-15
  Administered 2022-04-14 – 2022-04-15 (×2): 975 mg via ORAL
  Filled 2022-04-14 (×2): qty 3

## 2022-04-14 MED ORDER — FERROUS SULFATE 324 MG (65 MG IRON) TABLET,DELAYED RELEASE
324.0000 mg | DELAYED_RELEASE_TABLET | Freq: Every morning | ORAL | Status: DC
Start: 2022-04-15 — End: 2022-04-17
  Administered 2022-04-15 – 2022-04-17 (×3): 324 mg via ORAL
  Filled 2022-04-14 (×3): qty 1

## 2022-04-14 MED ORDER — ONDANSETRON HCL (PF) 4 MG/2 ML INJECTION SOLUTION
4.0000 mg | Freq: Once | INTRAMUSCULAR | Status: AC
Start: 2022-04-14 — End: 2022-04-14
  Administered 2022-04-14: 4 mg via INTRAVENOUS

## 2022-04-14 MED ORDER — OXYCODONE-ACETAMINOPHEN 5 MG-325 MG TABLET
2.0000 | ORAL_TABLET | ORAL | Status: DC | PRN
Start: 2022-04-14 — End: 2022-04-14

## 2022-04-14 MED ORDER — KETOROLAC 30 MG/ML (1 ML) INJECTION SOLUTION
15.0000 mg | Freq: Three times a day (TID) | INTRAMUSCULAR | Status: DC
Start: 2022-04-14 — End: 2022-04-14

## 2022-04-14 MED ORDER — PROCHLORPERAZINE EDISYLATE 10 MG/2 ML (5 MG/ML) INJECTION SOLUTION
5.0000 mg | Freq: Once | INTRAMUSCULAR | Status: DC | PRN
Start: 2022-04-14 — End: 2022-04-14

## 2022-04-14 MED ORDER — ROCURONIUM 10 MG/ML INTRAVENOUS SOLUTION
Freq: Once | INTRAVENOUS | Status: DC | PRN
Start: 2022-04-14 — End: 2022-04-14
  Administered 2022-04-14: 40 mg via INTRAVENOUS

## 2022-04-14 MED ORDER — TRAMADOL 50 MG TABLET
50.0000 mg | ORAL_TABLET | Freq: Four times a day (QID) | ORAL | Status: DC
Start: 2022-04-14 — End: 2022-04-14

## 2022-04-14 MED ORDER — NALOXONE 0.4 MG/ML INJECTION SOLUTION
0.4000 mg | INTRAMUSCULAR | Status: DC | PRN
Start: 2022-04-14 — End: 2022-04-17

## 2022-04-14 MED ORDER — ACETAMINOPHEN 325 MG TABLET
975.0000 mg | ORAL_TABLET | Freq: Four times a day (QID) | ORAL | Status: DC
Start: 2022-04-14 — End: 2022-04-14

## 2022-04-14 MED ORDER — DOCUSATE SODIUM 100 MG CAPSULE
100.0000 mg | ORAL_CAPSULE | Freq: Two times a day (BID) | ORAL | Status: DC
Start: 2022-04-14 — End: 2022-04-14

## 2022-04-14 MED ORDER — HYDROMORPHONE 2 MG/ML INJECTION WRAPPER
0.2000 mg | INJECTION | Freq: Once | INTRAMUSCULAR | Status: AC
Start: 2022-04-14 — End: 2022-04-14
  Administered 2022-04-14: 0.2 mg via INTRAVENOUS
  Filled 2022-04-14: qty 1

## 2022-04-14 MED ORDER — ONDANSETRON HCL (PF) 4 MG/2 ML INJECTION SOLUTION
4.0000 mg | INTRAMUSCULAR | Status: DC | PRN
Start: 2022-04-14 — End: 2022-04-17
  Administered 2022-04-16: 4 mg via INTRAVENOUS
  Filled 2022-04-14: qty 2

## 2022-04-14 MED ORDER — BISACODYL 10 MG RECTAL SUPPOSITORY
10.0000 mg | Freq: Every day | RECTAL | Status: DC | PRN
Start: 2022-04-14 — End: 2022-04-14

## 2022-04-14 MED ORDER — SUGAMMADEX 100 MG/ML INTRAVENOUS SOLUTION
Freq: Once | INTRAVENOUS | Status: DC | PRN
Start: 2022-04-14 — End: 2022-04-14
  Administered 2022-04-14: 300 mg via INTRAVENOUS

## 2022-04-14 MED ORDER — DEXAMETHASONE SODIUM PHOSPHATE (PF) 10 MG/ML INJECTION SOLUTION
8.0000 mg | Freq: Two times a day (BID) | INTRAMUSCULAR | Status: AC
Start: 2022-04-14 — End: 2022-04-15
  Administered 2022-04-14: 8 mg via INTRAVENOUS
  Administered 2022-04-15: 0 mg via INTRAVENOUS
  Filled 2022-04-14: qty 1

## 2022-04-14 MED ORDER — LIDOCAINE (PF) 100 MG/5 ML (2 %) INTRAVENOUS SYRINGE
INJECTION | Freq: Once | INTRAVENOUS | Status: DC | PRN
Start: 2022-04-14 — End: 2022-04-14
  Administered 2022-04-14: 50 mg via INTRAVENOUS

## 2022-04-14 MED ORDER — FAMOTIDINE 20 MG TABLET
20.0000 mg | ORAL_TABLET | Freq: Two times a day (BID) | ORAL | Status: DC
Start: 2022-04-14 — End: 2022-04-14

## 2022-04-14 MED ORDER — FENTANYL (PF) 50 MCG/ML INJECTION WRAPPER
25.0000 ug | INJECTION | INTRAMUSCULAR | Status: DC | PRN
Start: 2022-04-14 — End: 2022-04-14

## 2022-04-14 MED ORDER — HYDROMORPHONE 2 MG/ML INJECTION WRAPPER
0.2000 mg | INJECTION | INTRAMUSCULAR | Status: DC | PRN
Start: 2022-04-14 — End: 2022-04-14

## 2022-04-14 MED ORDER — ONDANSETRON HCL (PF) 4 MG/2 ML INJECTION SOLUTION
4.0000 mg | INTRAMUSCULAR | Status: DC | PRN
Start: 2022-04-14 — End: 2022-04-14

## 2022-04-14 MED ORDER — KETOROLAC 30 MG/ML (1 ML) INJECTION SOLUTION
15.0000 mg | Freq: Three times a day (TID) | INTRAMUSCULAR | Status: AC
Start: 2022-04-14 — End: 2022-04-16
  Administered 2022-04-14 – 2022-04-16 (×6): 15 mg via INTRAVENOUS
  Filled 2022-04-14 (×6): qty 1

## 2022-04-14 MED ORDER — CEFAZOLIN 1 GRAM SOLUTION FOR INJECTION
INTRAMUSCULAR | Status: AC
Start: 2022-04-14 — End: 2022-04-14
  Filled 2022-04-14: qty 20

## 2022-04-14 MED ORDER — ASPIRIN 81 MG CHEWABLE TABLET
81.0000 mg | CHEWABLE_TABLET | Freq: Two times a day (BID) | ORAL | Status: DC
Start: 2022-04-15 — End: 2022-04-17
  Administered 2022-04-15 – 2022-04-17 (×5): 81 mg via ORAL
  Filled 2022-04-14 (×5): qty 1

## 2022-04-14 MED ORDER — TRAMADOL 50 MG TABLET
50.0000 mg | ORAL_TABLET | Freq: Four times a day (QID) | ORAL | Status: AC
Start: 2022-04-14 — End: 2022-04-16
  Administered 2022-04-14 – 2022-04-16 (×8): 50 mg via ORAL
  Filled 2022-04-14 (×8): qty 1

## 2022-04-14 MED ORDER — ALBUTEROL SULFATE 2.5 MG/3 ML (0.083 %) SOLUTION FOR NEBULIZATION
2.5000 mg | INHALATION_SOLUTION | Freq: Once | RESPIRATORY_TRACT | Status: DC | PRN
Start: 2022-04-14 — End: 2022-04-14

## 2022-04-14 MED ORDER — MORPHINE 2 MG/ML INJECTION WRAPPER
2.0000 mg | INJECTION | INTRAMUSCULAR | Status: DC | PRN
Start: 2022-04-14 — End: 2022-04-14

## 2022-04-14 MED ORDER — DEXAMETHASONE SODIUM PHOSPHATE (PF) 10 MG/ML INJECTION SOLUTION
8.0000 mg | Freq: Two times a day (BID) | INTRAMUSCULAR | Status: DC
Start: 2022-04-14 — End: 2022-04-14

## 2022-04-14 MED ORDER — FENTANYL (PF) 50 MCG/ML INJECTION WRAPPER
50.0000 ug | INJECTION | INTRAMUSCULAR | Status: DC | PRN
Start: 2022-04-14 — End: 2022-04-14

## 2022-04-14 MED ORDER — MULTIVITAMIN-FERROUS FUMARATE-FOLIC ACID 18 MG-400 MCG TABLET
1.0000 | ORAL_TABLET | Freq: Every day | ORAL | Status: DC
Start: 2022-04-14 — End: 2022-04-17
  Administered 2022-04-14 – 2022-04-17 (×4): 1 via ORAL
  Filled 2022-04-14 (×4): qty 1

## 2022-04-14 MED ORDER — MORPHINE 4 MG/ML INJECTION WRAPPER
3.0000 mg | INJECTION | INTRAMUSCULAR | Status: DC | PRN
Start: 2022-04-14 — End: 2022-04-14
  Filled 2022-04-14: qty 1

## 2022-04-14 MED ORDER — MIDAZOLAM 5 MG/ML INJECTION WRAPPER
INTRAMUSCULAR | Status: AC
Start: 2022-04-14 — End: 2022-04-14
  Filled 2022-04-14: qty 1

## 2022-04-14 MED ORDER — ONDANSETRON HCL (PF) 4 MG/2 ML INJECTION SOLUTION
4.0000 mg | Freq: Once | INTRAMUSCULAR | Status: DC | PRN
Start: 2022-04-14 — End: 2022-04-14

## 2022-04-14 MED ORDER — DOCUSATE SODIUM 100 MG CAPSULE
100.0000 mg | ORAL_CAPSULE | Freq: Two times a day (BID) | ORAL | Status: DC
Start: 2022-04-14 — End: 2022-04-17
  Administered 2022-04-14 – 2022-04-17 (×6): 100 mg via ORAL
  Filled 2022-04-14 (×6): qty 1

## 2022-04-14 MED ORDER — CHLORTHALIDONE 25 MG TABLET
25.0000 mg | ORAL_TABLET | Freq: Every day | ORAL | Status: DC
Start: 2022-04-14 — End: 2022-04-17
  Administered 2022-04-14 – 2022-04-17 (×4): 0 mg via ORAL

## 2022-04-14 MED ORDER — PROPOFOL 10 MG/ML IV BOLUS
INJECTION | Freq: Once | INTRAVENOUS | Status: DC | PRN
Start: 2022-04-14 — End: 2022-04-14
  Administered 2022-04-14: 100 mg via INTRAVENOUS

## 2022-04-14 MED ORDER — ATORVASTATIN 10 MG TABLET
10.0000 mg | ORAL_TABLET | Freq: Every evening | ORAL | Status: DC
Start: 2022-04-14 — End: 2022-04-17
  Administered 2022-04-14: 10 mg via ORAL
  Administered 2022-04-14: 0 mg via ORAL
  Administered 2022-04-15 – 2022-04-16 (×2): 10 mg via ORAL
  Filled 2022-04-14 (×4): qty 1

## 2022-04-14 MED ORDER — INSULIN REGULAR HUMAN 100 UNIT/ML INJECTION SSIP
0.0000 [IU] | INJECTION | Freq: Four times a day (QID) | SUBCUTANEOUS | Status: DC | PRN
Start: 2022-04-14 — End: 2022-04-15
  Administered 2022-04-14 (×2): 6 [IU] via SUBCUTANEOUS
  Filled 2022-04-14 (×2): qty 18

## 2022-04-14 MED ORDER — TRANEXAMIC ACID 1,000 MG/10 ML (100 MG/ML) INTRAVENOUS SOLUTION
INTRAVENOUS | Status: AC
Start: 2022-04-14 — End: 2022-04-14
  Filled 2022-04-14: qty 10

## 2022-04-14 MED ORDER — MIDAZOLAM 5 MG/ML INJECTION WRAPPER
1.0000 mg | Freq: Once | INTRAMUSCULAR | Status: DC | PRN
Start: 2022-04-14 — End: 2022-04-14
  Administered 2022-04-14: 1 mg via INTRAVENOUS

## 2022-04-14 MED ORDER — ACETAMINOPHEN 325 MG TABLET
650.0000 mg | ORAL_TABLET | ORAL | Status: DC | PRN
Start: 2022-04-14 — End: 2022-04-14

## 2022-04-14 MED ORDER — CEFAZOLIN 1 GRAM SOLUTION FOR INJECTION
Freq: Once | INTRAMUSCULAR | Status: DC | PRN
Start: 2022-04-14 — End: 2022-04-14
  Administered 2022-04-14: 2000 mg via INTRAVENOUS

## 2022-04-14 MED ORDER — ONDANSETRON HCL (PF) 4 MG/2 ML INJECTION SOLUTION
INTRAMUSCULAR | Status: AC
Start: 2022-04-14 — End: 2022-04-14
  Filled 2022-04-14: qty 2

## 2022-04-14 MED ORDER — FAMOTIDINE 20 MG TABLET
20.0000 mg | ORAL_TABLET | Freq: Two times a day (BID) | ORAL | Status: AC
Start: 2022-04-14 — End: 2022-04-16
  Administered 2022-04-14 – 2022-04-16 (×4): 20 mg via ORAL
  Filled 2022-04-14 (×4): qty 1

## 2022-04-14 MED ORDER — BISACODYL 10 MG RECTAL SUPPOSITORY
10.0000 mg | Freq: Every day | RECTAL | Status: DC | PRN
Start: 2022-04-14 — End: 2022-04-17

## 2022-04-14 SURGICAL SUPPLY — 58 items
BLADE 10 2 END CBNSTL SURG STRL DISP (SURGICAL CUTTING SUPPLIES) ×2 IMPLANT
CLEANER INSTR PREPZYME MUL-TRD CONTAINR NARSL NEUT PH BDGR (MISCELLANEOUS PT CARE ITEMS) ×1
CLOSURE SKIN STRIPS 1/2X4IN_R1547 6/PK 50PK/BX (WOUND CARE/ENTEROSTOMAL SUPPLY) ×1
CONV USE ITEM 321837 - GLOVE SURG 7.5 LTX PF NONST CRM (GLOVES AND ACCESSORIES) ×1 IMPLANT
CONV USE ITEM 321854 - GLOVE SURG 6 LF  BEAD CUF SMOOTH HI GRIP WHT 12IN MDCHC PLISPRN (GLOVES AND ACCESSORIES) ×1 IMPLANT
CONV USE ITEM 321983 - GLOVE SURG 8 LTX CHEMO PF SMOOTH BEAD CUF STRL WHT 11.6IN PLMR THK.2MM THK.21MM (GLOVES AND ACCESSORIES) ×1 IMPLANT
CONV USE ITEM 323185 - PAD EG 15SQ IN UNIV FOAM SPLT NONCORD ADULT 9100 SER (SURGICAL CUTTING SUPPLIES) ×1 IMPLANT
CONV USE ITEM 329146 - CLEANER INSTR PREPZYME MUL-TRD CONTAINR NARSL NEUT PH BDGR 22OZ (MISCELLANEOUS PT CARE ITEMS) ×1 IMPLANT
COUNTER 20 CNT BLOCK ADH NEEDLE STRL LF  RD SHARP FOAM 15.75X11.5X14IN DISP (MED SURG SUPPLIES) ×1 IMPLANT
COUNTER 20 CNT BLOCK ADH NEEDLE STRL LF RD SHARP FOAM 15.75 (MED SURG SUPPLIES) ×1
COVER TBL 90X50IN STD SMS REINF FNFLD STRL LF  DISP (DRAPE/PACKS/SHEETS/OR TOWEL) ×2 IMPLANT
COVER TBL 90X50IN STD SMS REINF FNFLD STRL LF DISP (DRAPE/PACKS/SHEETS/OR TOWEL) ×2
DRAPE 2 INCS FILM PCH ISOL ADH STRP 125X83IN STRDRP IOBN STRL SURG PLASTIC 19INX9 3/8IN CLR (DRAPE/PACKS/SHEETS/OR TOWEL) ×1 IMPLANT
DRAPE 2 INCS FILM PCH ISOL ADH_STRP 125X83IN STRDRP IOBN (DRAPE/PACKS/SHEETS/OR TOWEL) ×1
DRAPE CARM FLRSCP EXPD CLPSBL C-ARMOR STRL EQP (DRAPE/PACKS/SHEETS/OR TOWEL) ×1 IMPLANT
DRAPE CARM FLRSCP EXPD CLPSBL_C-ARMOR STRL EQP (DRAPE/PACKS/SHEETS/OR TOWEL) ×1
DRAPE CARM MBL XRY BAND 64X42IN LRG EQP RUB (DRAPE/PACKS/SHEETS/OR TOWEL) ×1 IMPLANT
DRAPE CARM MBL XRY BAND 64X42I_N LRG EQP RUB (DRAPE/PACKS/SHEETS/OR TOWEL) ×1
DRAPE FNFLD ABS REINF 77X53IN 43528 PRXM LF  STRL DISP SURG SMS 44X23IN (DRAPE/PACKS/SHEETS/OR TOWEL) ×1 IMPLANT
DRAPE FNFLD ABS REINF 77X53IN_43528 PRXM LF STRL DISP SURG (DRAPE/PACKS/SHEETS/OR TOWEL) ×1
DRESS 10X4IN POSTOP STRL MPLX BR FOAM DISP (WOUND CARE SUPPLY) ×1 IMPLANT
DURAPREP 26ML 8630 CS/20 (MED SURG SUPPLIES) ×1
GLOVE SURG 6 LF  PF SMOOTH BEAD CUF STRL GRN 12IN SENSICARE PI PLISPRN PLMR ALOE THK7.9 MIL DISP (GLOVES AND ACCESSORIES) ×1 IMPLANT
GLOVE SURG 6 LF PF SMOOTH BEAD CUF STRL GRN 12IN SENSICARE (GLOVES AND ACCESSORIES) ×1
GLOVE SURG 6 LF PF SMOOTH STRL WHT PLISPRN (GLOVES AND ACCESSORIES) ×1
GLOVE SURG 7.5 LF PF SMOOTH STRL WHT PLISPRN (GLOVES AND ACCESSORIES) ×1
GLOVE SURG 7.5 LTX PF SMOOTH STRL CRM (GLOVES AND ACCESSORIES) ×1
GLOVE SURG 8 LTX PF SMOOTH STRL CRM (GLOVES AND ACCESSORIES) ×1
GLOVE SURG LF  PF STRL 7.5 PLISPRN DISP (GLOVES AND ACCESSORIES) ×1 IMPLANT
GOWN SURG LRG STD LGTH REG L3 NONREINFORCE BRTHBL TWL STRL (DRAPE/PACKS/SHEETS/OR TOWEL) ×2
GOWN SURG LRG STD LGTH REG L3 NONREINFORCE BRTHBL TWL STRL LF  DISP BLU HALYARD SPECTRUM SMS (DRAPE/PACKS/SHEETS/OR TOWEL) ×2 IMPLANT
GOWN SURG XL STD LGTH L3 NONREINFORCE HKLP CLSR TWL STRL LF (DRAPE/PACKS/SHEETS/OR TOWEL) ×1
GOWN SURG XL STD LGTH L3 NONREINFORCE HKLP CLSR TWL STRL LF  DISP BLU SPECTRUM SMS (DRAPE/PACKS/SHEETS/OR TOWEL) ×1
GOWN SURG XL STD LGTH L3 NONREINFORCE HKLP CLSR TWL STRL LF DISP BLU SPECTRUM SMS (DRAPE/PACKS/SHEETS/OR TOWEL) ×1 IMPLANT
HDPE THK22 UM C40-45 GL L48 IN X W40 IN NATURAL (MISCELLANEOUS PT CARE ITEMS) ×2 IMPLANT
LINER SUCT MEDIVAC CRD TW LOCK LID SHTOF VALVE CAN PORT 3L LF  DISP (MED SURG SUPPLIES) ×1 IMPLANT
LINER SUCT MEDIVAC CRD TW LOCK_LID SHTOF VALVE CAN PORT 3L (MED SURG SUPPLIES) ×1
MATTRESS TRANSF 34IN HOVERMATT BRTHBL NONST LF  DISP (MED SURG SUPPLIES) ×1 IMPLANT
MATTRESS TRANSF 34IN HOVERMATT_BRTHBL NONST LF DISP (MED SURG SUPPLIES) ×1
PAD EG 15SQ IN UNIV FOAM SPLT NONCORD ADULT 9100 SER (CUTTING ELEMENTS) ×1
PEN SURG MRKNG DISP RLR LBL STRL LF  6IN (MED SURG SUPPLIES) ×1 IMPLANT
PEN SURG MRKNG DISP RLR LBL STRL LF 6IN (MED SURG SUPPLIES) ×1
SCREW BONE ASNIS 6.5MM 85MM CANN SLF TAP SLF DRILL LOW PROF HEAD SS P/T RVRS CUT FLUTE 20MM NONST (IMPLANTS TRAUMA) ×1 IMPLANT
SCREW BONE ASNIS 6.5MM 90MM CANN SLF TAP SLF DRILL LOW PROF HEAD SS P/T RVRS CUT FLUTE 20MM NONST (IMPLANTS TRAUMA) ×2 IMPLANT
SCREW BONE ASNIS 6.5MM 95MM CANN SLF TAP SLF DRILL LOW PROF HEAD SS P/T RVRS CUT FLUTE 20MM NONST (IMPLANTS TRAUMA) ×1 IMPLANT
SOL IRRG 0.9% NACL 1000ML PLASTIC PR BTL ISTNC N-PYRG STRL LF (MEDICATIONS/SOLUTIONS) ×1 IMPLANT
SOL SURG PREP 26ML DRPRP 74% ISPRP 0.7% IOD POVACRYLEX SLF CNTN APPL SKIN STRL PREOP (MED SURG SUPPLIES) ×1 IMPLANT
SOLUTION IRRG NS 2F7124 1000CC_12/CS (MEDICATIONS/SOLUTIONS) ×1
SPONGE LAP 18X18IN PREWASH RIGID TRY STRL LF  WHT (MED SURG SUPPLIES) ×1 IMPLANT
SPONGE LAP 18X18IN PREWASH RIGID TRY STRL LF WHT (MED SURG SUPPLIES) ×1
STRIP 4X.5IN STRSTRP PLSTR REINF SKNCLS WHT STRL LF (WOUND CARE SUPPLY) ×1 IMPLANT
SUTURE 2-0 C-23 POLYSRB 30IN UNDYED BRD COAT ABS (SUTURE/WOUND CLOSURE) ×1 IMPLANT
SUTURE 4-0 C-13 BIOSYN 30IN UNDYED MONOF ABS (SUTURE/WOUND CLOSURE) ×1 IMPLANT
TOWEL 24X16IN COTTON BLU DISP SURG STRL LF (DRAPE/PACKS/SHEETS/OR TOWEL) ×2 IMPLANT
TUBE BUBBLE CONNECTING_8888280214 1EA/BX/CS (MED SURG SUPPLIES) ×1
TUBING SUCT CLR 100FT 3/16IN ARGYLE UNIV PVC NCDTV BBL NONST LF (MED SURG SUPPLIES) ×1 IMPLANT
TUBING SUCT CLR 6FT .25IN ARGYLE PVC NCDTV STR MALE FEMALE (MED SURG SUPPLIES) ×1
TUBING SUCT CLR 6FT .25IN ARGYLE PVC NCDTV STR MALE FEMALE MLD CONN STRL LF (MED SURG SUPPLIES) ×1 IMPLANT

## 2022-04-14 NOTE — Anesthesia Transfer of Care (Signed)
ANESTHESIA TRANSFER OF CARE   Brandy Flores is a 66 y.o. ,female, Weight: 74.2 kg (163 lb 8 oz)   had Procedure(s):  OPEN REDUCTION INTERNAL FIXATION LEFT HIP FRACTURE USING ASNIS SCREWS  performed  04/14/22   Primary Service: Alvina Chou, DO    Past Medical History:   Diagnosis Date    Arthropathy     Diabetes mellitus, type 2 (CMS HCC)     Esophageal reflux     HTN (hypertension)     Hypercholesterolemia     Perforation of tympanic membrane     Renal cyst       Allergy History as of 04/14/22       MORPHINE         Noted Status Severity Type Reaction    04/13/22 2001 Cathren Laine, RN 04/30/08 Active Low  Nausea/ Vomiting    04/30/08 Sheets, Amber, LPN 16/10/96 Active                 SULFAMETHOXAZOLE-TRIMETHOPRIM         Noted Status Severity Type Reaction    04/13/22 2001 Cathren Laine, RN 04/30/08 Active Low  Itching    04/30/08 Sheets, Amber, LPN 04/54/09 Active                 AMOXICILLIN-POT CLAVULANATE         Noted Status Severity Type Reaction    04/13/22 2001 Cathren Laine, RN 04/30/08 Active Low  Itching    04/30/08 Sheets, Amber, LPN 81/19/14 Active                 ACETAMINOPHEN-CODEINE         Noted Status Severity Type Reaction    04/13/22 2001 Cathren Laine, RN 04/30/08 Active Low  Nausea/ Vomiting    04/30/08 Sheets, Amber, LPN 78/29/56 Active                     I completed my transfer of care / handoff to the receiving personnel during which we discussed:  Airway, All key/critical aspects of case discussed, Analgesia, Fluids/Product, Gave opportunity for questions and acknowledgement of understanding and PMHx                              Additional Info:Pt awakening, no c/o pain, breathing easily, vss, report given to include that pt was very sedated on arrival to OR and was on 2lpm NC.  History of HTN, DM.                                    Last OR Temp: Temperature: 36.1 C (97 F)  ABG:  POTASSIUM   Date Value Ref Range Status   04/13/2022 4.4 3.5 - 5.1 mmol/L Final      KETONES   Date Value Ref Range Status   04/13/2022 Negative Negative mg/dL Final     CALCIUM   Date Value Ref Range Status   04/13/2022 9.8 8.5 - 10.1 mg/dL Final     Calculated P Axis   Date Value Ref Range Status   04/13/2022 61 degrees Incomplete     Calculated R Axis   Date Value Ref Range Status   04/13/2022 15 degrees Incomplete     Calculated T Axis   Date Value Ref Range Status   04/13/2022 19 degrees Incomplete  Airway:* No LDAs found *  Blood pressure 132/73, pulse 84, temperature 36.1 C (97 F), resp. rate 20, height 1.626 m (5\' 4" ), weight 74.2 kg (163 lb 8 oz), SpO2 100 %.

## 2022-04-14 NOTE — OR Surgeon (Signed)
[facility]   Operative Note   PATIENT NAME:  Brandy, Flores  MRN:  [id number]  DOB:  1955/10/31    Date of Procedure:  04/13/2022 - 04/14/2022  Preoperative Diagnosis: LEFT HIP FRACTURE   Postoperative Diagnoses:  LEFT HIP FRACTURE   Procedure Performed: Procedure(s) (LRB):  OPEN REDUCTION INTERNAL FIXATION LEFT HIP FRACTURE USING ASNIS SCREWS (Left)    Surgeon: Quenton Fetter, DO   Anesthesia: General  Estimated Blood Loss: Minimal  Complications: None immediate  Description of Procedure patient taken to the operating room given a general anesthetic placed on the fracture table left hip prepped with DuraPrep draped in a sterile manner small incision was utilized 3 guide pins were placed perpendicular fracture line parallel to 1 another cannulated screws were then placed over the guidewires in the standard manner with nice fixation.  Guidewires were removed images throughout the procedure verified satisfactory alignment of the fracture fragments with well placed fixation.  Wound was irrigated closed in layers sterile bandage applied patient was awoken from anesthesia brought to recovery in stable condition.  Quenton Fetter, DO   This note was partially generated using MModal Fluency Direct system, and there may be some incorrect words, spellings, and punctuation that were not noted in checking the note before saving.

## 2022-04-14 NOTE — Progress Notes (Signed)
H&P reviewed.    Left hip fracture/left femoral subcapital neck fracture  AKI      -IV fluids  -orthopedics consulted. For operative procedure

## 2022-04-14 NOTE — Consults (Signed)
[place]  Orthopedic History and Physical    Patient Name: Brandy Flores   MRN: [id number]   Date of Birth: 1955/08/18  Date: 04/14/2022   Age: 66 y.o.    Gender: female  Attending Physician: Winfred Burn, MD     Chief Complaint:  Left hip fracture     Subjective:    HPI:  Brandy Flores is a 66 y.o. female who presents to [facility]  following a fall while at bingo.  Fracture of the left hip noted.  Patient presented to [facility] and was transferred for [facility] for definitive care, orthopedic consultation obtained.  No loss of consciousness or associated injury.  Gardner Candle, FNP-BC   Nurse Practitioner  PRN Hospitalist 3     H&P      Cosign Needed     Date of Service: 04/14/22 0011     Expand All Collapse All         [facility]  History and Physical     Date of Service:  04/14/2022  Brandy Flores, 66 y.o. female  Encounter Start Date:  04/13/2022  Inpatient Admission Date: 04/13/2022  Date of Birth:  01/05/1956  PCP: No Pcp         Chief Complaint:  Fall with injury      HPI: Brandy Flores is a 66 y.o., White female who presents to [facility] from [facility] for fall with injury.  Patient was seen examined at bedside, family present during exam.  Patient states that she was at bingo today and was shoveling some chairs whenever she lost her balance falling on her left hip.  Patient states she did not lose consciousness nor does she get dizzy before the fall.  Patient states she stumbled over the chair which caused her to fall.  Patient has history of arthritis.  Patient was found to have an acute left femoral subcapital neck fracture.  Patient also some AKI with a bump in the creatinine of 1.40.  It appears patient's baseline is around 0.9.  Patient denies any other complaints at this time.  Patient will be admitted to hospitalist services for further evaluation.     Past Medical History:         Past Medical History:   Diagnosis Date    Arthropathy      Diabetes mellitus, type 2  (CMS HCC)      Esophageal reflux      HTN (hypertension)      Hypercholesterolemia      Perforation of tympanic membrane      Renal cyst                    Medications Prior to Admission         Prescriptions     ATIVAN 0.5 mg Tab     take 1 Tab by mouth Twice per day as needed for Anxiety.      Patient not taking:  Reported on 04/13/2022     chlorTHALIDONE (HYGROTON) 25 mg Oral Tablet     Take 1 Tablet (25 mg total) by mouth Once a day     empagliflozin (JARDIANCE) 10 mg Oral Tablet     Take 1 Tablet (10 mg total) by mouth Once a day     glipiZIDE (GLUCOTROL) 5 mg Oral Tablet     Take 2 Tablets (10 mg total) by mouth Every morning before breakfast Take 30 minutes before meals     lisinopriL (PRINIVIL) 2.5 mg  Oral Tablet     Take 1 Tablet (2.5 mg total) by mouth Once a day     LORCET 10/650 10-650 mg Tab     take 1 Tab by mouth Three times a day as needed for Pain.      Patient not taking:  Reported on 04/13/2022     MetFORMIN (GLUCOPHAGE) 1,000 mg Oral Tablet     Take 1 Tablet (1,000 mg total) by mouth Twice daily with food     nitroGLYCERIN (NITROSTAT) 0.4 mg Sublingual Tablet, Sublingual     Place 1 Tablet (0.4 mg total) under the tongue Every 5 minutes as needed for Chest pain for 3 doses over 15 minutes     pantoprazole (PROTONIX) 40 mg Oral Tablet, Delayed Release (E.C.)     Take 1 Tablet (40 mg total) by mouth Once a day     PREVACID 30 mg CpDR     take 1 Cap by mouth Once a day.      Patient not taking:  Reported on 04/13/2022     simvastatin (ZOCOR) 20 mg Oral Tablet     Take 1 Tablet (20 mg total) by mouth Every evening                  Allergies   Allergen Reactions    Augmentin [Amoxicillin-Pot Clavulanate] Itching    Bactrim [Sulfamethoxazole-Trimethoprim] Itching    Morphine Nausea/ Vomiting    Tylenol-Codeine #3 [Acetaminophen-Codeine] Nausea/ Vomiting         Past Surgical History:        Past Surgical History:   Procedure Laterality Date    ANTERIOR CERVICAL DISCECTOMY W/ FUSION        HX CESAREAN  SECTION         3    HX EAR SURGERY        KNEE ARTHROSCOPY Right                 Family History:  Family Medical History:            Problem Relation (Age of Onset)     Breast Cancer Sister     Cancer Mother     Diabetes Father     No Known Problems Brother, Maternal Aunt, Maternal Uncle, Paternal Aunt, Paternal Uncle, Maternal Grandmother, Maternal Grandfather, Paternal Grandmother, Paternal Grandfather, Daughter, Son, Other                      Social History:  Social History            Tobacco Use    Smoking status: Never       Passive exposure: Never    Smokeless tobacco: Never   Vaping Use    Vaping Use: Never used   Substance Use Topics    Alcohol use: Not Currently    Drug use: Never         Review of Systems:  All systems are reviewed and are negative except those mentioned in the HPI portion     Examination:  BP (!) 141/82   Pulse 91   Temp 36.5 C (97.7 F)   Resp 18   Ht 1.626 m (5\' 4" )   Wt 74.2 kg (163 lb 8 oz)   SpO2 96%   BMI 28.06 kg/m            General: Patient is alert and oriented to person, place and time.     HEENT: Pupils are of  round shape, equal in size, and reactive to light bilaterally. Oral mucous membranes are moist.     Heart: S1 and S2 are present. No appreciable murmur.     Lungs: Breath sounds are appreciated at all posterior lung fields, no appreciable crackles, wheezes, or rhonchi.     Gastrointestinal: Bowel sounds are appreciated at all 4 quadrants. Abdomen is soft, not appreciably distended, non-tender to palpation at all quadrants.     Extremities: Radial pulses are 3/4 bilaterally, dorsalis pedis pulses are 3/4 bilaterally. Capillary refill is less than 3 seconds at distal digits bilaterally. No appreciable edema of the lower extremities. Left leg shortened      Genitourinary: No appreciable suprapubic tenderness.     Neurologic: Follows commands appropriately. No appreciable facial droop. No appreciable focal weakness of the bilateral upper or lower extremities.      Skin: Grossly intact at observable areas.     Labs:    Lab Results Today:          Results for orders placed or performed during the hospital encounter of 04/13/22 (from the past 24 hour(s))   COMPREHENSIVE METABOLIC PANEL, NON-FASTING   Result Value Ref Range     SODIUM 136 136 - 145 mmol/L     POTASSIUM 4.4 3.5 - 5.1 mmol/L     CHLORIDE 100 98 - 107 mmol/L     CO2 TOTAL 25 21 - 32 mmol/L     ANION GAP 11 4 - 13 mmol/L     BUN 15 7 - 18 mg/dL     CREATININE 1.40 (H) 0.55 - 1.02 mg/dL     BUN/CREA RATIO 11       ESTIMATED GFR 41 (L) >59 mL/min/1.39m^2     ALBUMIN 3.9 3.4 - 5.0 g/dL     CALCIUM 9.8 8.5 - 10.1 mg/dL     GLUCOSE 390 (H) 74 - 106 mg/dL     ALKALINE PHOSPHATASE 114 46 - 116 U/L     ALT (SGPT) 24 <=78 U/L     AST (SGOT) 23 15 - 37 U/L     BILIRUBIN TOTAL 0.5 0.2 - 1.0 mg/dL     PROTEIN TOTAL 7.5 6.4 - 8.2 g/dL     ALBUMIN/GLOBULIN RATIO 1.1 0.8 - 1.4     OSMOLALITY, CALCULATED 289 270 - 290 mOsm/kg     CALCIUM, CORRECTED 9.9 mg/dL     GLOBULIN 3.6     PT/INR   Result Value Ref Range     PROTHROMBIN TIME 12.1 9.8 - 12.7 seconds     INR 1.04 0.88 - 1.10   PTT (PARTIAL THROMBOPLASTIN TIME)   Result Value Ref Range     APTT 39.4 (H) 22.0 - 31.7 seconds   CBC WITH DIFF   Result Value Ref Range     WBCS UNCORRECTED         WBC 12.3 (H) 4.0 - 10.5 x10^3/uL     RBC 4.98 4.20 - 5.40 x10^6/uL     HGB 15.1 12.5 - 16.0 g/dL     HCT 44.5 37.0 - 47.0 %     MCV 89.4 78.0 - 99.0 fL     MCH 30.4 27.0 - 32.0 pg     MCHC 34.0 32.0 - 36.0 g/dL     RDW 14.9 (H) 11.6 - 14.8 %     PLATELETS 201 140 - 440 x10^3/uL     MPV 8.1 7.4 - 10.4 fL     NEUTROPHIL % 74 40 -  76 %     LYMPHOCYTE % 18 (L) 25 - 45 %     MONOCYTE % 6 0 - 12 %     EOSINOPHIL % 2 0 - 7 %     BASOPHIL % 0 0 - 3 %     NEUTROPHIL # 9.06 (H) 1.80 - 8.40 x10^3/uL     LYMPHOCYTE # 2.15 1.10 - 5.00 x10^3/uL     MONOCYTE # 0.79 0.00 - 1.30 x10^3/uL     EOSINOPHIL # 0.25 0.00 - 0.80 x10^3/uL     BASOPHIL # 0.03 0.00 - 0.30 x10^3/uL   ECG 12 LEAD   Result Value Ref  Range     Ventricular rate 88 BPM     Atrial Rate 88 BPM     PR Interval 160 ms     QRS Duration 74 ms     QT Interval 348 ms     QTC Calculation 421 ms     Calculated P Axis 61 degrees     Calculated R Axis 15 degrees     Calculated T Axis 19 degrees   URINALYSIS, MACRO/MICRO   Result Value Ref Range     COLOR Light Yellow (A) Yellow     APPEARANCE Hazy (A) Clear     SPECIFIC GRAVITY 1.010 1.003 - 1.035     PH 6.0 4.6 - 8.0     LEUKOCYTES Trace (A) Negative WBCs/uL     NITRITE Positive (A) Negative     PROTEIN Negative Negative mg/dL     GLUCOSE >=1443 (A) Negative mg/dL     KETONES Negative Negative mg/dL     BILIRUBIN Negative Negative mg/dL     BLOOD Negative Negative mg/dL     UROBILINOGEN 0.2 0.2 - 1.0 mg/dL   URINALYSIS, MICROSCOPIC   Result Value Ref Range     RBCS 0-3 0-3, Not Present /hpf     BACTERIA Many (A) Negative /hpf     MUCOUS Occasional (A) (none) /hpf     WBCS 20-50 (A) Not Present, Occasional, 0-5 /hpf     SQUAMOUS EPITHELIAL Several (A) Not Present, Few /hpf   COVID-19, FLU A/B, RSV RAPID BY PCR   Result Value Ref Range     SARS-CoV-2 Not Detected Not Detected     INFLUENZA VIRUS TYPE A Not Detected Not Detected     INFLUENZA VIRUS TYPE B Not Detected Not Detected     RESPIRATORY SYNCTIAL VIRUS (RSV) Not Detected Not Detected         Imaging Studies:      Results for orders placed or performed during the hospital encounter of 04/13/22 (from the past 24 hour(s))   XR HIP LEFT W PELVIS 2-3 VIEWS     Status: None     Narrative     Myrla Twersky     RADIOLOGISTLucien Mons     XR HIP LEFT W PELVIS 2-3 VIEWS performed on 04/13/2022 8:45 PM     CLINICAL HISTORY: fall.  FALL TODAY. EXTREME PAIN LEFT HIP. PT CANNOT STRAIGHTEN LEG ALL THE WAY DUE TO THE PAIN.     TECHNIQUE:  4 views of the left hip including AP pelvis.     COMPARISON:  CT abdomen and pelvis dated 09/06/2019     FINDINGS:   There is a left femoral subcapital neck fracture.  Normal alignment.  Soft tissues are unremarkable.            Impression     Acute left  femoral subcapital neck fracture.               Radiologist location ID: [id number]      XR LUMBAR SPINE AP AND LAT     Status: None     Narrative     Brandice Cranmer     RADIOLOGIST: Ileene Hutchinson     XR LUMBAR SPINE AP AND LAT performed on 04/13/2022 8:45 PM     CLINICAL HISTORY: fall.  FALL TODAY. EXTREME PAIN LEFT HIP. PT CANNOT STRAIGHTEN LEG ALL THE WAY DUE TO THE PAIN.     TECHNIQUE:  2 views of the lumbar spine.     COMPARISON: None.     FINDINGS:  Normal lumbar vertebral heights.  No evidence of fracture.  Mild multilevel disc space narrowing.  Normal alignment.  No spondylolisthesis.           Impression     MILD DEGENERATIVE CHANGES OF THE LUMBAR SPINE. NO ACUTE FINDINGS.              Radiologist location ID: [id number]      XR FEMUR LEFT     Status: None     Narrative     Schubert Glassco     RADIOLOGIST: Ileene Hutchinson     XR FEMUR LEFT- 2 VIEWS performed on 04/13/2022 8:45 PM     CLINICAL HISTORY: fall.  FALL TODAY. EXTREME PAIN LEFT HIP. PT CANNOT STRAIGHTEN LEG ALL THE WAY DUE TO THE PAIN.     TECHNIQUE:  2 views of the left femur.     COMPARISON:  None.     FINDINGS:   There is an acute fracture of the subcapital left femoral neck.  Normal alignment at the hip and knee.  Soft tissues are unremarkable.           Impression     Acute left femoral subcapital neck fracture.           Radiologist location ID: [id number]      XR HAND LEFT 2 VIEW     Status: None     Narrative     Fieldale Lepkowski     RADIOLOGIST: Ileene Hutchinson     XR HAND LEFT 2 VIEWS performed on 04/13/2022 8:45 PM     CLINICAL HISTORY: fall.  FALL TODAY. EXTREME PAIN LEFT HIP. PT CANNOT STRAIGHTEN LEG ALL THE WAY DUE TO THE PAIN.     TECHNIQUE:  2 views of the left hand     COMPARISON:  None.     FINDINGS:   No visible fracture.  No suspicious bone lesion.  Normal alignment.  Mild degenerative changes.  Soft tissues are unremarkable.           Impression     DEGENERATIVE OSTEOARTHROSIS. NO ACUTE FINDINGS.                Radiologist location ID: [id number]            Assessment/Plan:       Active Hospital Problems     Diagnosis    Primary Problem: Closed left hip fracture (CMS HCC)   Hyperglycemia      Plan to admit patient MIP.  Telemetry, continuous pulse ox, and supplemental oxygen titration p.r.n. will be ordered.  Will verify patient's home medications.  Patient will have SCDs ordered for DVT prophylaxis.  Patient will remain NPO at this time.  When patient is able to eat patient will be order diabetic diet, Accu-Cheks AC and  HS will be ordered with sliding scale coverage.  Plan to consult Orthopedic to see patient in a.m..  IV normal saline at 50 cc/hour will reorder for patient.  IV morphine 3 mg q.3 hours p.r.n. will be ordered for patient.  Will repeat labs in a.m.Marland Kitchen  See attending addendum and orders for further information.     DVT/PE Prophylaxis: SCDs/ Venodynes/Impulse boots     Gardner Candle, FNP-BC     Contents of the document, in whole or in part, are completed utilizing M*Modal dictation technology, please forgive any typographical errors that may exist.                REVIEW OF SYSTEMS  General: No fatigue, fevers, chills  Ear, nose and throat: No dysphagia or nosebleeds.   Eyes: No change in vision.   Cardiovascular: No chest pain, dyspnea on exertion. No abnormal heartbeat.    Lung: No cough or shortness of breath.   Skin: No rash.   Neurological: No seizures, tremors or headaches.   Psychiatric: No hallucinations or psychosis     Past Medical History:   Diagnosis Date    Arthropathy     Diabetes mellitus, type 2 (CMS HCC)     Esophageal reflux     HTN (hypertension)     Hypercholesterolemia     Perforation of tympanic membrane     Renal cyst            Past Surgical History:   Procedure Laterality Date    ANTERIOR CERVICAL DISCECTOMY W/ FUSION      HX CESAREAN SECTION      3    HX EAR SURGERY      KNEE ARTHROSCOPY Right            @MEDSSCHEDULED @     Allergies   Allergen Reactions    Augmentin  [Amoxicillin-Pot Clavulanate] Itching    Bactrim [Sulfamethoxazole-Trimethoprim] Itching    Morphine Nausea/ Vomiting    Tylenol-Codeine #3 [Acetaminophen-Codeine] Nausea/ Vomiting       Social History     Socioeconomic History    Marital status: Married     Spouse name: Not on file    Number of children: Not on file    Years of education: Not on file    Highest education level: Not on file   Occupational History    Not on file   Tobacco Use    Smoking status: Never     Passive exposure: Never    Smokeless tobacco: Never   Vaping Use    Vaping Use: Never used   Substance and Sexual Activity    Alcohol use: Not Currently    Drug use: Never    Sexual activity: Not Currently   Other Topics Concern    Not on file   Social History Narrative    Not on file     Social Determinants of Health     Financial Resource Strain: Not on file   Transportation Needs: Not on file   Social Connections: Not on file   Intimate Partner Violence: Not on file   Housing Stability: Not on file       Objective:    Last Filed Vitals:  Filed Vitals:    04/13/22 2230 04/13/22 2330 04/14/22 0038 04/14/22 1007   BP: 124/81 (!) 141/82 124/83    Pulse: 94 91 95 79   Resp:  18     Temp:  36.5 C (97.7 F) 36.2 C (97.1 F)  SpO2: 99% 96% 98%       Body mass index is 28.06 kg/m.   Cognitive awake alert and oriented x3    Constitutional: Appears comfortable, no acute distress.  Alert and oriented x 3 .  Cardiovascular: Regular rate, no peripheral edema.  Respiratory: Normal respiratory effort  Head: NCAT    Musculoskeletal Exam:   Pain palpation left hip positive log rolling.  Distal CMS intact.    Radiographs:   @RADRESULTS2DAYS @  Subcapital fracture left hip with impaction and valgus angulation.  Garden 1.  for doctor to add    BMP:   136 (09/12 2101) 100 (09/12 2101) 15 (09/12 2101)    /     390* (09/12 2101)   4.4 (09/12 2101) 25 (09/12 2101) 1.40* (09/12 2101) \              CBC:     12.3* (09/12 2101) \   15.1 (09/12 2101) /   201 (09/12 2101)       / 44.5 (09/12 2101) \             Lab Data   Results from last 7 days   Lab Units 04/13/22  2145 04/13/22  2101   CO2 mmol/L  --  25   BUN mg/dL  --  15   CREA mg/dL  --  1.40*   GLUC mg/dL >=1000*  --    CA mg/dL  --  9.8   ALKP U/L  --  114   SGPT U/L  --  24   SGOT U/L  --  23     Lab Results   Component Value Date    WBC 12.3 (H) 04/13/2022    HGB 15.1 04/13/2022    HCT 44.5 04/13/2022     Lab Results   Component Value Date    APTT 39.4 (H) 04/13/2022    INR 1.04 04/13/2022     No results found for: TROPONINI, BNP  No results found for: HGBA1C    Microbiology:      INFLUENZA VIRUS TYPE A   Date Value Ref Range Status   04/13/2022 Not Detected Not Detected Final     INFLUENZA VIRUS TYPE B   Date Value Ref Range Status   04/13/2022 Not Detected Not Detected Final           Impression and Plan:    Impression:   Garden 1 subcapital fracture left hip.      Plan:  Lengthy discussion with the patient concerning options including insight to pinning with cannulated screws versus bipolar hip arthroplasty.  Given the patient's good health and relatively young age I have recommended cannulated screw fixation.  We discussed the risk of aseptic necrosis patient understands would like to proceed.  Risks benefits and complications discussed surgical informed consent was obtained.

## 2022-04-14 NOTE — Care Plan (Signed)
Problem: Adult Inpatient Plan of Care  Goal: Plan of Care Review  Outcome: Ongoing (see interventions/notes)  Goal: Patient-Specific Goal (Individualized)  Outcome: Ongoing (see interventions/notes)  Goal: Absence of Hospital-Acquired Illness or Injury  Outcome: Ongoing (see interventions/notes)  Intervention: Identify and Manage Fall Risk  Recent Flowsheet Documentation  Taken 04/14/2022 1600 by Bryne Lindon, Victorino Dike, RN  Safety Promotion/Fall Prevention:   fall prevention program maintained   nonskid shoes/slippers when out of bed   safety round/check completed  Taken 04/14/2022 0800 by Julienne Vogler, Victorino Dike, RN  Safety Promotion/Fall Prevention:   activity supervised   fall prevention program maintained   safety round/check completed   nonskid shoes/slippers when out of bed  Intervention: Prevent Infection  Recent Flowsheet Documentation  Taken 04/14/2022 1600 by Jodette Wik, Victorino Dike, RN  Infection Prevention:   barrier precautions utilized   rest/sleep promoted  Taken 04/14/2022 0800 by Joelyn Lover, Victorino Dike, RN  Infection Prevention: barrier precautions utilized  Goal: Optimal Comfort and Wellbeing  Outcome: Ongoing (see interventions/notes)  Goal: Rounds/Family Conference  Outcome: Ongoing (see interventions/notes)     Problem: Fall Injury Risk  Goal: Absence of Fall and Fall-Related Injury  Outcome: Ongoing (see interventions/notes)  Intervention: Promote Scientist, clinical (histocompatibility and immunogenetics) Documentation  Taken 04/14/2022 1600 by Leslie Jester, Victorino Dike, RN  Safety Promotion/Fall Prevention:   fall prevention program maintained   nonskid shoes/slippers when out of bed   safety round/check completed  Taken 04/14/2022 0800 by Dalaney Needle, Victorino Dike, RN  Safety Promotion/Fall Prevention:   activity supervised   fall prevention program maintained   safety round/check completed   nonskid shoes/slippers when out of bed     Problem: Pain Acute  Goal: Optimal Pain Control and Function  Outcome: Ongoing (see  interventions/notes)     Problem: Orthopaedic Fracture  Goal: Absence of Bleeding  Outcome: Ongoing (see interventions/notes)  Goal: Effective Bowel Elimination  Outcome: Ongoing (see interventions/notes)  Goal: Absence of Embolism Signs and Symptoms  Outcome: Ongoing (see interventions/notes)  Goal: Fracture Stability  Outcome: Ongoing (see interventions/notes)  Goal: Optimal Functional Ability  Outcome: Ongoing (see interventions/notes)  Goal: Absence of Infection Signs and Symptoms  Outcome: Ongoing (see interventions/notes)  Goal: Effective Tissue Perfusion  Outcome: Ongoing (see interventions/notes)  Goal: Optimal Pain Control and Function  Outcome: Ongoing (see interventions/notes)  Goal: Effective Oxygenation and Ventilation  Outcome: Ongoing (see interventions/notes)

## 2022-04-14 NOTE — Anesthesia Preprocedure Evaluation (Signed)
ANESTHESIA PRE-OP EVALUATION  Planned Procedure: OPEN REDUCTION INTERNAL FIXATION LEFT HIP FRACTURE USING ASNIS SCREWS (Left: Hip)  Review of Systems     anesthesia history negative     patient summary reviewed  nursing notes reviewed        Pulmonary  negative pulmonary ROS,    Cardiovascular    Hypertension, well controlled, ECG reviewed, ACE / ARB inhibitor use and hyperlipidemia ,No peripheral edema,  Exercise Tolerance: > or = 4 METS        GI/Hepatic/Renal    GERD, well controlled and acute renal failure        Endo/Other    osteoarthritis,   type 2 diabetes/ stable/ controlled with oral medications    Neuro/Psych/MS   negative neuro/psych ROS,      Cancer    negative hematology/oncology ROS,                   Physical Assessment      Airway       Mallampati: III    TM distance: >3 FB    Neck ROM: full  Mouth Opening: good.            Dental           (+) edentulous           Pulmonary    Breath sounds clear to auscultation  (-) no rhonchi, no decreased breath sounds, no wheezes, no rales and no stridor     Cardiovascular    Rhythm: regular  Rate: Normal  (-) no friction rub, carotid bruit is not present, no peripheral edema and no murmur     Other findings            Plan  ASA 3 - emergent     Planned anesthesia type: general     general anesthesia with endotracheal tube intubation    plan to administer opioids postoperatively            PONV/POV Plan:  I plan to administer pharmcologic prophalaxis antiemetics  Intravenous induction     Anesthesia issues/risks discussed are: Eye /Visual Loss, Nerve Injuries, PONV, Stroke, Aspiration, Difficult Airway, Cardiac Events/MI, Intraoperative Awareness/ Recall, Blood Loss and Sore Throat.  Anesthetic plan and risks discussed with patient  signed consent obtained        Use of blood products discussed with patient who consented to blood products.      Patient's NPO status is appropriate for Anesthesia.           Plan discussed with CRNA.

## 2022-04-14 NOTE — H&P (Signed)
[facility]  History and Physical    Date of Service:  04/14/2022  Brandy Flores, 66 y.o. female  Encounter Start Date:  04/13/2022  Inpatient Admission Date: 04/13/2022  Date of Birth:  03/02/1956  PCP: No Pcp       Chief Complaint:  Fall with injury     HPI: Brandy Flores is a 66 y.o., White female who presents to [facility] from [facility] for fall with injury.  Patient was seen examined at bedside, family present during exam.  Patient states that she was at [employer] today and was shoveling some chairs whenever she lost her balance falling on her left hip.  Patient states she did not lose consciousness nor does she get dizzy before the fall.  Patient states she stumbled over the chair which caused her to fall.  Patient has history of arthritis.  Patient was found to have an acute left femoral subcapital neck fracture.  Patient also some AKI with a bump in the creatinine of 1.40.  It appears patient's baseline is around 0.9.  Patient denies any other complaints at this time.  Patient will be admitted to hospitalist services for further evaluation.    Past Medical History:    Past Medical History:   Diagnosis Date    Arthropathy     Diabetes mellitus, type 2 (CMS HCC)     Esophageal reflux     HTN (hypertension)     Hypercholesterolemia     Perforation of tympanic membrane     Renal cyst              Medications Prior to Admission       Prescriptions    ATIVAN 0.5 mg Tab    take 1 Tab by mouth Twice per day as needed for Anxiety.     Patient not taking:  Reported on 04/13/2022    chlorTHALIDONE (HYGROTON) 25 mg Oral Tablet    Take 1 Tablet (25 mg total) by mouth Once a day    empagliflozin (JARDIANCE) 10 mg Oral Tablet    Take 1 Tablet (10 mg total) by mouth Once a day    glipiZIDE (GLUCOTROL) 5 mg Oral Tablet    Take 2 Tablets (10 mg total) by mouth Every morning before breakfast Take 30 minutes before meals    lisinopriL (PRINIVIL) 2.5 mg Oral Tablet    Take 1 Tablet (2.5 mg total) by mouth Once a day    LORCET  10/650 10-650 mg Tab    take 1 Tab by mouth Three times a day as needed for Pain.     Patient not taking:  Reported on 04/13/2022    MetFORMIN (GLUCOPHAGE) 1,000 mg Oral Tablet    Take 1 Tablet (1,000 mg total) by mouth Twice daily with food    nitroGLYCERIN (NITROSTAT) 0.4 mg Sublingual Tablet, Sublingual    Place 1 Tablet (0.4 mg total) under the tongue Every 5 minutes as needed for Chest pain for 3 doses over 15 minutes    pantoprazole (PROTONIX) 40 mg Oral Tablet, Delayed Release (E.C.)    Take 1 Tablet (40 mg total) by mouth Once a day    PREVACID 30 mg CpDR    take 1 Cap by mouth Once a day.     Patient not taking:  Reported on 04/13/2022    simvastatin (ZOCOR) 20 mg Oral Tablet    Take 1 Tablet (20 mg total) by mouth Every evening          Allergies  Allergen Reactions    Augmentin [Amoxicillin-Pot Clavulanate] Itching    Bactrim [Sulfamethoxazole-Trimethoprim] Itching    Morphine Nausea/ Vomiting    Tylenol-Codeine #3 [Acetaminophen-Codeine] Nausea/ Vomiting       Past Surgical History:  Past Surgical History:   Procedure Laterality Date    ANTERIOR CERVICAL DISCECTOMY W/ FUSION      HX CESAREAN SECTION      3    HX EAR SURGERY      KNEE ARTHROSCOPY Right            Family History:  Family Medical History:       Problem Relation (Age of Onset)    Breast Cancer Sister    Cancer Mother    Diabetes Father    No Known Problems Brother, Maternal Aunt, Maternal Uncle, Paternal 42, Paternal Uncle, Maternal Grandmother, Maternal Grandfather, Paternal 64, Paternal Grandfather, Daughter, Son, Other               Social History:  Social History     Tobacco Use    Smoking status: Never     Passive exposure: Never    Smokeless tobacco: Never   Vaping Use    Vaping Use: Never used   Substance Use Topics    Alcohol use: Not Currently    Drug use: Never        Review of Systems:  All systems are reviewed and are negative except those mentioned in the HPI portion    Examination:  BP (!) 141/82   Pulse 91   Temp  36.5 C (97.7 F)   Resp 18   Ht 1.626 m (5\' 4" )   Wt 74.2 kg (163 lb 8 oz)   SpO2 96%   BMI 28.06 kg/m         General: Patient is alert and oriented to person, place and time.    HEENT: Pupils are of round shape, equal in size, and reactive to light bilaterally. Oral mucous membranes are moist.    Heart: S1 and S2 are present. No appreciable murmur.    Lungs: Breath sounds are appreciated at all posterior lung fields, no appreciable crackles, wheezes, or rhonchi.    Gastrointestinal: Bowel sounds are appreciated at all 4 quadrants. Abdomen is soft, not appreciably distended, non-tender to palpation at all quadrants.    Extremities: Radial pulses are 3/4 bilaterally, dorsalis pedis pulses are 3/4 bilaterally. Capillary refill is less than 3 seconds at distal digits bilaterally. No appreciable edema of the lower extremities. Left leg shortened     Genitourinary: No appreciable suprapubic tenderness.    Neurologic: Follows commands appropriately. No appreciable facial droop. No appreciable focal weakness of the bilateral upper or lower extremities.    Skin: Grossly intact at observable areas.    Labs:    Lab Results Today:    Results for orders placed or performed during the hospital encounter of 04/13/22 (from the past 24 hour(s))   COMPREHENSIVE METABOLIC PANEL, NON-FASTING   Result Value Ref Range    SODIUM 136 136 - 145 mmol/L    POTASSIUM 4.4 3.5 - 5.1 mmol/L    CHLORIDE 100 98 - 107 mmol/L    CO2 TOTAL 25 21 - 32 mmol/L    ANION GAP 11 4 - 13 mmol/L    BUN 15 7 - 18 mg/dL    CREATININE 1.40 (H) 0.55 - 1.02 mg/dL    BUN/CREA RATIO 11     ESTIMATED GFR 41 (L) >59 mL/min/1.59m^2    ALBUMIN  3.9 3.4 - 5.0 g/dL    CALCIUM 9.8 8.5 - 76.7 mg/dL    GLUCOSE 341 (H) 74 - 106 mg/dL    ALKALINE PHOSPHATASE 114 46 - 116 U/L    ALT (SGPT) 24 <=78 U/L    AST (SGOT) 23 15 - 37 U/L    BILIRUBIN TOTAL 0.5 0.2 - 1.0 mg/dL    PROTEIN TOTAL 7.5 6.4 - 8.2 g/dL    ALBUMIN/GLOBULIN RATIO 1.1 0.8 - 1.4    OSMOLALITY, CALCULATED  289 270 - 290 mOsm/kg    CALCIUM, CORRECTED 9.9 mg/dL    GLOBULIN 3.6    PT/INR   Result Value Ref Range    PROTHROMBIN TIME 12.1 9.8 - 12.7 seconds    INR 1.04 0.88 - 1.10   PTT (PARTIAL THROMBOPLASTIN TIME)   Result Value Ref Range    APTT 39.4 (H) 22.0 - 31.7 seconds   CBC WITH DIFF   Result Value Ref Range    WBCS UNCORRECTED      WBC 12.3 (H) 4.0 - 10.5 x10^3/uL    RBC 4.98 4.20 - 5.40 x10^6/uL    HGB 15.1 12.5 - 16.0 g/dL    HCT 93.7 90.2 - 40.9 %    MCV 89.4 78.0 - 99.0 fL    MCH 30.4 27.0 - 32.0 pg    MCHC 34.0 32.0 - 36.0 g/dL    RDW 73.5 (H) 32.9 - 14.8 %    PLATELETS 201 140 - 440 x10^3/uL    MPV 8.1 7.4 - 10.4 fL    NEUTROPHIL % 74 40 - 76 %    LYMPHOCYTE % 18 (L) 25 - 45 %    MONOCYTE % 6 0 - 12 %    EOSINOPHIL % 2 0 - 7 %    BASOPHIL % 0 0 - 3 %    NEUTROPHIL # 9.06 (H) 1.80 - 8.40 x10^3/uL    LYMPHOCYTE # 2.15 1.10 - 5.00 x10^3/uL    MONOCYTE # 0.79 0.00 - 1.30 x10^3/uL    EOSINOPHIL # 0.25 0.00 - 0.80 x10^3/uL    BASOPHIL # 0.03 0.00 - 0.30 x10^3/uL   ECG 12 LEAD   Result Value Ref Range    Ventricular rate 88 BPM    Atrial Rate 88 BPM    PR Interval 160 ms    QRS Duration 74 ms    QT Interval 348 ms    QTC Calculation 421 ms    Calculated P Axis 61 degrees    Calculated R Axis 15 degrees    Calculated T Axis 19 degrees   URINALYSIS, MACRO/MICRO   Result Value Ref Range    COLOR Light Yellow (A) Yellow    APPEARANCE Hazy (A) Clear    SPECIFIC GRAVITY 1.010 1.003 - 1.035    PH 6.0 4.6 - 8.0    LEUKOCYTES Trace (A) Negative WBCs/uL    NITRITE Positive (A) Negative    PROTEIN Negative Negative mg/dL    GLUCOSE >=9242 (A) Negative mg/dL    KETONES Negative Negative mg/dL    BILIRUBIN Negative Negative mg/dL    BLOOD Negative Negative mg/dL    UROBILINOGEN 0.2 0.2 - 1.0 mg/dL   URINALYSIS, MICROSCOPIC   Result Value Ref Range    RBCS 0-3 0-3, Not Present /hpf    BACTERIA Many (A) Negative /hpf    MUCOUS Occasional (A) (none) /hpf    WBCS 20-50 (A) Not Present, Occasional, 0-5 /hpf    SQUAMOUS EPITHELIAL  Several (A) Not  Present, Few /hpf   COVID-19, FLU A/B, RSV RAPID BY PCR   Result Value Ref Range    SARS-CoV-2 Not Detected Not Detected    INFLUENZA VIRUS TYPE A Not Detected Not Detected    INFLUENZA VIRUS TYPE B Not Detected Not Detected    RESPIRATORY SYNCTIAL VIRUS (RSV) Not Detected Not Detected       Imaging Studies:  Results for orders placed or performed during the hospital encounter of 04/13/22 (from the past 24 hour(s))   XR HIP LEFT W PELVIS 2-3 VIEWS     Status: None    Narrative    Whitnee Hellard    RADIOLOGISTIleene Hutchinson    XR HIP LEFT W PELVIS 2-3 VIEWS performed on 04/13/2022 8:45 PM    CLINICAL HISTORY: fall.  FALL TODAY. EXTREME PAIN LEFT HIP. PT CANNOT STRAIGHTEN LEG ALL THE WAY DUE TO THE PAIN.    TECHNIQUE:  4 views of the left hip including AP pelvis.    COMPARISON:  CT abdomen and pelvis dated 09/06/2019    FINDINGS:   There is a left femoral subcapital neck fracture.  Normal alignment.  Soft tissues are unremarkable.        Impression    Acute left femoral subcapital neck fracture.           Radiologist location ID: [id number]     XR LUMBAR SPINE AP AND LAT     Status: None    Narrative    Chantelle Wachsmuth    RADIOLOGIST: Ileene Hutchinson    XR LUMBAR SPINE AP AND LAT performed on 04/13/2022 8:45 PM    CLINICAL HISTORY: fall.  FALL TODAY. EXTREME PAIN LEFT HIP. PT CANNOT STRAIGHTEN LEG ALL THE WAY DUE TO THE PAIN.    TECHNIQUE:  2 views of the lumbar spine.    COMPARISON: None.    FINDINGS:  Normal lumbar vertebral heights.  No evidence of fracture.  Mild multilevel disc space narrowing.  Normal alignment.  No spondylolisthesis.        Impression    MILD DEGENERATIVE CHANGES OF THE LUMBAR SPINE. NO ACUTE FINDINGS.          Radiologist location ID: [id number]     XR FEMUR LEFT     Status: None    Narrative    [place] Ikner    RADIOLOGIST: Ileene Hutchinson    XR FEMUR LEFT- 2 VIEWS performed on 04/13/2022 8:45 PM    CLINICAL HISTORY: fall.  FALL TODAY. EXTREME PAIN LEFT HIP. PT CANNOT  STRAIGHTEN LEG ALL THE WAY DUE TO THE PAIN.    TECHNIQUE:  2 views of the left femur.    COMPARISON:  None.    FINDINGS:   There is an acute fracture of the subcapital left femoral neck.  Normal alignment at the hip and knee.  Soft tissues are unremarkable.        Impression    Acute left femoral subcapital neck fracture.        Radiologist location ID: [id number]     XR HAND LEFT 2 VIEW     Status: None    Narrative    [place] Raneri    RADIOLOGIST: Ileene Hutchinson    XR HAND LEFT 2 VIEWS performed on 04/13/2022 8:45 PM    CLINICAL HISTORY: fall.  FALL TODAY. EXTREME PAIN LEFT HIP. PT CANNOT STRAIGHTEN LEG ALL THE WAY DUE TO THE PAIN.    TECHNIQUE:  2 views of the left hand    COMPARISON:  None.    FINDINGS:   No visible fracture.  No suspicious bone lesion.  Normal alignment.  Mild degenerative changes.  Soft tissues are unremarkable.        Impression    DEGENERATIVE OSTEOARTHROSIS. NO ACUTE FINDINGS.           Radiologist location ID: [id number]          Assessment/Plan:   Active Hospital Problems    Diagnosis    Primary Problem: Closed left hip fracture (CMS HCC)   Hyperglycemia     Plan to admit patient MIP.  Telemetry, continuous pulse ox, and supplemental oxygen titration p.r.n. will be ordered.  Will verify patient's home medications.  Patient will have SCDs ordered for DVT prophylaxis.  Patient will remain NPO at this time.  When patient is able to eat patient will be order diabetic diet, Accu-Cheks AC and HS will be ordered with sliding scale coverage.  Plan to consult Orthopedic to see patient in a.m..  IV normal saline at 50 cc/hour will reorder for patient.  IV morphine 3 mg q.3 hours p.r.n. will be ordered for patient.  Will repeat labs in a.m.Marland Kitchen  See attending addendum and orders for further information.    DVT/PE Prophylaxis: SCDs/ Venodynes/Impulse boots    Gardner Candle, FNP-BC    Contents of the document, in whole or in part, are completed utilizing M*Modal dictation technology, please forgive  any typographical errors that may exist.

## 2022-04-14 NOTE — Anesthesia Postprocedure Evaluation (Signed)
Anesthesia Post Op Evaluation    Patient: Brandy Flores  Procedure(s):  OPEN REDUCTION INTERNAL FIXATION LEFT HIP FRACTURE USING ASNIS SCREWS    Last Vitals:Temperature: 36.1 C (97 F) (04/14/22 1540)  Heart Rate: 77 (04/14/22 1550)  BP (Non-Invasive): (!) 101/57 (04/14/22 1550)  Respiratory Rate: 16 (04/14/22 1550)  SpO2: 97 % (04/14/22 1550)    There were no known notable events for this encounter.    Patient is sufficiently recovered from the effects of anesthesia to participate in the evaluation and has returned to their pre-procedure level.  Patient location during evaluation: PACU       Patient participation: complete - patient participated  Level of consciousness: awake and alert and responsive to verbal stimuli    Pain management: adequate  Airway patency: patent    Anesthetic complications: no  Cardiovascular status: acceptable  Respiratory status: acceptable  Hydration status: acceptable  Patient post-procedure temperature: Pt Normothermic   PONV Status: Absent

## 2022-04-14 NOTE — Nurses Notes (Signed)
transport taking patient downstairs to holding area for surgery on left hip. Victorino Dike Abdo Denault, RN

## 2022-04-14 NOTE — Care Plan (Signed)
Problem: Adult Inpatient Plan of Care  Goal: Plan of Care Review  Outcome: Ongoing (see interventions/notes)  Goal: Patient-Specific Goal (Individualized)  Outcome: Ongoing (see interventions/notes)  Flowsheets (Taken 04/13/2022 2357)  Individualized Care Needs: prn pain medication  Anxieties, Fears or Concerns: pain control  Patient-Specific Goals (Include Timeframe): discharge when criteria met  Goal: Absence of Hospital-Acquired Illness or Injury  Outcome: Ongoing (see interventions/notes)  Intervention: Identify and Manage Fall Risk  Recent Flowsheet Documentation  Taken 04/14/2022 0000 by Susa Loffler, RN  Safety Promotion/Fall Prevention:   activity supervised   fall prevention program maintained   nonskid shoes/slippers when out of bed   safety round/check completed  Intervention: Prevent Skin Injury  Recent Flowsheet Documentation  Taken 04/14/2022 0000 by Susa Loffler, RN  Body Position: semi-fowlers (30-45 degrees)  Skin Protection: adhesive use limited  Intervention: Prevent Infection  Recent Flowsheet Documentation  Taken 04/14/2022 0000 by Susa Loffler, RN  Infection Prevention:   promote handwashing   rest/sleep promoted  Goal: Optimal Comfort and Wellbeing  Outcome: Ongoing (see interventions/notes)  Intervention: Provide Person-Centered Care  Recent Flowsheet Documentation  Taken 04/14/2022 0000 by Susa Loffler, RN  Trust Relationship/Rapport:   care explained   choices provided   emotional support provided  Goal: Rounds/Family Conference  Outcome: Ongoing (see interventions/notes)     Problem: Fall Injury Risk  Goal: Absence of Fall and Fall-Related Injury  Outcome: Ongoing (see interventions/notes)  Intervention: Promote Injury-Free Environment  Recent Flowsheet Documentation  Taken 04/14/2022 0000 by Susa Loffler, RN  Safety Promotion/Fall Prevention:   activity supervised   fall prevention program maintained   nonskid shoes/slippers when out of bed   safety round/check completed     Problem: Pain  Acute  Goal: Optimal Pain Control and Function  Outcome: Ongoing (see interventions/notes)     Problem: Orthopaedic Fracture  Goal: Absence of Bleeding  Outcome: Ongoing (see interventions/notes)  Goal: Effective Bowel Elimination  Outcome: Ongoing (see interventions/notes)  Goal: Absence of Embolism Signs and Symptoms  Outcome: Ongoing (see interventions/notes)  Goal: Fracture Stability  Outcome: Ongoing (see interventions/notes)  Goal: Optimal Functional Ability  Outcome: Ongoing (see interventions/notes)  Goal: Absence of Infection Signs and Symptoms  Outcome: Ongoing (see interventions/notes)  Intervention: Prevent or Manage Infection  Recent Flowsheet Documentation  Taken 04/14/2022 0000 by Susa Loffler, RN  Fever Reduction/Comfort Measures:   lightweight bedding   lightweight clothing  Goal: Effective Tissue Perfusion  Outcome: Ongoing (see interventions/notes)  Goal: Optimal Pain Control and Function  Outcome: Ongoing (see interventions/notes)  Goal: Effective Oxygenation and Ventilation  Outcome: Ongoing (see interventions/notes)  Intervention: Optimize Oxygenation and Ventilation  Recent Flowsheet Documentation  Taken 04/14/2022 0000 by Susa Loffler, RN  Head of Bed [facility]) Positioning: HOB at 30-45 degrees

## 2022-04-15 LAB — CBC WITH DIFF
BASOPHIL #: 0 10*3/uL (ref 0.00–0.10)
BASOPHIL %: 0 % (ref 0–1)
EOSINOPHIL #: 0 10*3/uL (ref 0.00–0.50)
EOSINOPHIL %: 0 % — ABNORMAL LOW
HCT: 39 % (ref 31.2–41.9)
HGB: 12.8 g/dL (ref 10.9–14.3)
LYMPHOCYTE #: 0.7 10*3/uL — ABNORMAL LOW (ref 1.00–3.00)
LYMPHOCYTE %: 13 % — ABNORMAL LOW (ref 16–44)
MCH: 30.2 pg (ref 24.7–32.8)
MCHC: 32.9 g/dL (ref 32.3–35.6)
MCV: 91.8 fL (ref 75.5–95.3)
MONOCYTE #: 0.2 10*3/uL — ABNORMAL LOW (ref 0.30–1.00)
MONOCYTE %: 3 % — ABNORMAL LOW (ref 5–13)
MPV: 8.2 fL (ref 7.9–10.8)
NEUTROPHIL #: 4.7 10*3/uL (ref 1.85–7.80)
NEUTROPHIL %: 84 % — ABNORMAL HIGH (ref 43–77)
PLATELETS: 164 10*3/uL (ref 140–440)
RBC: 4.24 10*6/uL (ref 3.63–4.92)
RDW: 14 % (ref 12.3–17.7)
WBC: 5.6 10*3/uL (ref 3.8–11.8)

## 2022-04-15 LAB — BASIC METABOLIC PANEL
ANION GAP: 10 mmol/L (ref 4–13)
BUN/CREA RATIO: 17 (ref 6–22)
BUN: 19 mg/dL (ref 7–25)
CALCIUM: 8.8 mg/dL (ref 8.6–10.3)
CHLORIDE: 103 mmol/L (ref 98–107)
CO2 TOTAL: 21 mmol/L (ref 21–31)
CREATININE: 1.15 mg/dL (ref 0.60–1.30)
ESTIMATED GFR: 52 mL/min/{1.73_m2} — ABNORMAL LOW (ref >59)
GLUCOSE: 589 mg/dL (ref 74–109)
OSMOLALITY, CALCULATED: 298 mOsm/kg — ABNORMAL HIGH (ref 270–290)
POTASSIUM: 5.4 mmol/L — ABNORMAL HIGH (ref 3.5–5.1)
SODIUM: 134 mmol/L — ABNORMAL LOW (ref 136–145)

## 2022-04-15 LAB — POC BLOOD GLUCOSE (RESULTS)
GLUCOSE, POC: 589 mg/dl (ref 50–500)
GLUCOSE, POC: 488 mg/dl (ref 50–500)
GLUCOSE, POC: 371 mg/dl (ref 50–500)
GLUCOSE, POC: 382 mg/dl (ref 50–500)
GLUCOSE, POC: 410 mg/dl (ref 50–500)
GLUCOSE, POC: 369 mg/dl (ref 50–500)

## 2022-04-15 LAB — POTASSIUM: POTASSIUM: 4.7 mmol/L (ref 3.5–5.1)

## 2022-04-15 LAB — MAGNESIUM: MAGNESIUM: 1.9 mg/dL (ref 1.9–2.7)

## 2022-04-15 MED ORDER — CHOLECALCIFEROL (VITAMIN D3) 25 MCG (1,000 UNIT) TABLET
1000.0000 [IU] | ORAL_TABLET | Freq: Every day | ORAL | 0 refills | Status: AC
Start: 2022-04-16 — End: 2022-05-16

## 2022-04-15 MED ORDER — INSULIN REGULAR HUMAN 100 UNIT/ML INJECTION SSIP
0.0000 [IU] | INJECTION | Freq: Four times a day (QID) | SUBCUTANEOUS | Status: DC | PRN
Start: 2022-04-15 — End: 2022-04-17
  Administered 2022-04-15 – 2022-04-16 (×6): 12 [IU] via SUBCUTANEOUS
  Administered 2022-04-16: 4 [IU] via SUBCUTANEOUS
  Administered 2022-04-16 – 2022-04-17 (×2): 12 [IU] via SUBCUTANEOUS
  Administered 2022-04-17: 4 [IU] via SUBCUTANEOUS
  Filled 2022-04-15 (×3): qty 36
  Filled 2022-04-15: qty 12
  Filled 2022-04-15 (×3): qty 36
  Filled 2022-04-15: qty 12
  Filled 2022-04-15 (×2): qty 36

## 2022-04-15 MED ORDER — ASPIRIN 81 MG CHEWABLE TABLET
81.0000 mg | CHEWABLE_TABLET | Freq: Two times a day (BID) | ORAL | 0 refills | Status: AC
Start: 2022-04-15 — End: 2022-04-29

## 2022-04-15 MED ORDER — SODIUM ZIRCONIUM CYCLOSILICATE 10 GRAM ORAL POWDER PACKET
10.0000 g | Freq: Every day | ORAL | Status: DC
Start: 2022-04-15 — End: 2022-04-17
  Administered 2022-04-15: 10 g via ORAL
  Administered 2022-04-16: 0 g via ORAL
  Administered 2022-04-17: 10 g via ORAL
  Filled 2022-04-15 (×3): qty 1

## 2022-04-15 NOTE — OT Evaluation (Signed)
[facility]  [place], [address]  [phone]  [phone]  Rehabilitation Services  Occupational Therapy Inpatient Initial Evaluation      Patient Name: Brandy Flores  Date of Birth: 09-08-1955  Height: Height: 162.6 cm (_0 )  Weight: Weight: 74.2 kg (163 lb 8 oz)  Room/Bed: 360/A  Payor: AET[employer]-MEDI-ADV / Plan: [employer] MEDICARE ADVANTAGE PPO / Product Type: PPO /         PMH:   Past Medical History:   Diagnosis Date    Arthropathy     Diabetes mellitus, type 2 (CMS HCC)     Esophageal reflux     HTN (hypertension)     Hypercholesterolemia     Perforation of tympanic membrane     Renal cyst            Assessment:     Patient is a 66 year old female who reported that she fell from a chair while participating at bingo Tuesday night, 04/13/2022.  Brought to ER  with x-rays revealing left hip fracture.  Patient had status post ORIF with ANSIS screws on 04/14/2022.  OT received orders and eval completed this date.  Patient very highly motivated, no deficits or concerns with patient's ability to utilize adaptive equipment for increased participation and independence while promoting safety during postop rehabilitative phase.  OT / Octavio Graves to continue with education/training along with issuing the following: Reacher, sock aid, a shoe horn, bath sponge with goal that patient will return demo use of adaptive equipment at supervision/modified independent level with goal of discharge home with family/friend assist.       Discharge Needs:   Equipment Recommendation:  Tub bench    The patient presents with mobility limitations due to impaired balance that significantly impair/prevent patient's ability to participate in mobility-related activities of daily living (MRADLs) including  ambulation and transfers in order to safely complete, toileting, bathing, food preparation, laundering/household tasks, safely entering/exiting the home. This functional mobility deficit can  be sufficiently resolved with the use of a Anticipated Equipment Needs at Discharge: (P) to be determined  in order to decrease the risk of falls, morbidity, and mortality in performance of these MRADLs.  Patient is able to safely use this assistive device.    Discharge Disposition:      JUSTIFICATION OF DISCHARGE RECOMMENDATION   Based on current diagnosis, functional performance prior to admission, and current functional performance, this patient requires continued OT services in Anticipated Discharge Disposition: (P) home with assist  in order to achieve significant functional improvements.    Plan:   Current Intervention:  Predicted Duration of Therapy: (P) until discharge    To provide Occupational therapy services  Therapy Frequency: (P) minimum of 1x/week for duration of Predicted Duration of Therapy: (P) until discharge  .       The risks/benefits of therapy have been discussed with the patient/caregiver and he/she is in agreement with the established plan of care.       Subjective & Objective     MEDICAL HISTORY:   Past Medical History:   Diagnosis Date    Arthropathy     Diabetes mellitus, type 2 (CMS HCC)     Esophageal reflux     HTN (hypertension)     Hypercholesterolemia     Perforation of tympanic membrane     Renal cyst          SURGICAL HISTORY:   Past Surgical History:   Procedure Laterality Date  ANTERIOR CERVICAL DISCECTOMY W/ FUSION      HX CESAREAN SECTION      3    HX EAR SURGERY      KNEE ARTHROSCOPY Right        ipp    INSERT FLOW SHEET     04/15/22 1050   Rehab Session   Document Type evaluation   Total OT Minutes: 25   Patient Effort excellent   General Information   Patient Profile Reviewed yes   Onset of Illness/Injury or Date of Surgery 04/13/22   General Observations of Patient Alert and oriented x4, agreeable to OT evaluation.  Nursing approved session   Pertinent History of Current Functional Problem Patient to ED on 04/13/22 s/p a fall landing on her left hip with immediate pain.  Xray in ED shows an acute left femoral subcapital neck fracture. She was also noted to have an AKI and hyperglycemia. She underwent an ORIF with ANSIS screws on 04/14/22.   Medical Lines PIV Line;Telemetry   Respiratory Status room air   Existing Precautions/Restrictions fall precautions;full code;lateral hip precautions;posterior hip precautions   Pre Treatment Status   Pre Treatment Patient Status Patient sitting in bedside chair or w/c;Call light within reach;Telephone within reach;Patient safety alarm activated;Nurse approved session   Support Present Pre Treatment  None   Communication Pre Treatment  Charge Nurse   Communication Pre Treatment Comment CLEARED FOR OT   Mutuality/Individual Preferences   Anxieties, Fears or Concerns GETTING BETTER AND GOING HOME AS SOON AS POSSIBLE   Individualized Care Needs PATIENT LIVES ALONE   Plan of Care Reviewed With patient   Living Environment   Living Environment Comment Patient reported that she lives alone in a 2 level home but does not have to go down to the basement unless doing laundry.  There are 6 steps, 1 rail for entrance/exit.  Patient has tub/shower combo, grab bars.  OT recommending tub bench and patient has detachable handheld shower sprayer.   Functional Level Prior   Ambulation 0 - independent   Transferring 0 - independent   Toileting 0 - independent   Bathing 0 - independent   Dressing 0 - independent   Eating 0 - independent   Communication 0 - understands/communicates without difficulty   Swallowing 0-->swallows foods/liquids without difficulty   Prior Functional Level Comment Patient reported no prior use of AE/DME for personal care needs.  Patient's home has some modifications such as grab bars because patient provided caregiver assistance to her spouse prior to his passing.   Self-Care   Dominant Hand right   Usual Activity Tolerance excellent   Current Activity Tolerance good   Important Activities family;hobbies;social;spiritual   Vital Signs    Pre-Treatment Heart Rate (beats/min) 70   Post-treatment Heart Rate (beats/min) 72   Pre SpO2 (%) 94   O2 Delivery Pre Treatment room air   Post SpO2 (%) 93   O2 Delivery Post Treatment room air   Pain Assessment   Pretreatment Pain Rating 2/10   Posttreatment Pain Rating 3/10   Pre/Posttreatment Pain Comment Left hip   Coping/Psychosocial   Observed Emotional State calm;cooperative;pleasant   Verbalized Emotional State acceptance   Family/Support System   Family/Support Persons friend  (Boyfriend/significant other)   Involvement in Care not present at bedside   Cognitive Assessment/Interventions   Behavior/Mood Observations behavior appropriate to situation, WNL/WFL   Orientation Status oriented x 4   Attention WNL/WFL   Follows Commands [facility]   Vision Assessment/Interventions   Visual  Impairment/Limitations WFL   RUE Assessment   RUE Assessment WFL- Within Functional Limits   LUE Assessment   LUE Assessment WFL- Within Functional Limits   Trunk Assessment   Trunk Assessment WFL-Within Functional Limits   Musculoskeletal   LLE Weight-Bearing Status weight-bearing as tolerated   Grip Strength   Grip Left (5/5) normal, left   Right Grip (5/5) normal, right   Transfer Assessment/Treatment   Sit-Stand Independence contact guard assist   Stand-Sit Independence contact guard assist   Sit-Stand-Sit, Assist Device walker, front wheeled   ADL Assessment/Intervention   ADL Comments Eating:  Setup.  Grooming:  Setup.  UB bathe/dress:  Setup/SBA. LB Bathe/dress: Min A.  Toileting:  Min A.  Functional transfers:  CGA/Min A.  OT/ota to provide follow-up issuance of the following:  Reacher, sock aid, a shoe horn, bath sponge to include education/training to increase patient's participation and independence with upper/lower body ADLs prior to planned discharge home with friends/family assist   Post Treatment Status   Post Treatment Patient Status Patient sitting in bedside chair or w/c;Call light within reach;Telephone within  reach;Patient safety alarm activated   Support Present Post Treatment  None   Communication Post Treatement Nurse   Communication Post Treatment Comment OT to follow-up with ADL training/adaptive equipment treatment during inpatient acute care stay   Care Plan Goals   OT Rehab Goals Bathing Goal;LB Dressing Goal;Toileting Goal;Transfer Training Goal   Bathing Goal   Bathing Goal, Date Established 04/15/22   Bathing Goal, Time to Achieve by discharge   Bathing Goal, Activity Type all bathing tasks   Bathing Goal, Independence Level stand-by assistance   Bathing Goal, Adaptive Equipment grab bars;reacher;scrub brush;shower head, detachable;sponge, long handled;tub bench   Bed Mobility Goal   Bed Mobility Goal, Date Established 04/15/22   Bed Mobility Goal, Time to Achieve by discharge   Bed Mobility Goal, Activity Type all bed mobility activities   Bed Mobility Goal, Independence Level stand-by assistance   Bed Mobility Goal, Assistive Device least restrictive assistive device   LB Dressing Goal   LB Dressing Goal, Date Established 04/15/22   LB Dressing Goal, Time to Achieve by discharge   LB Dressing Goal, Activity Type all lower body dressing tasks   LB Dressing Goal, Independence Level stand-by assistance   LB Dressing Goal, Adaptive Equipment reacher;shoe horn, long handled;sock-aid;laces, elastic   Toileting Goal   Toileting Goal, Date Established 04/15/22   Toileting Goal, Time to Achieve by discharge   Toileting Goal, Activity Type all toileting tasks   Toileting Goal, Independence Level stand-by assistance   Toileting Goal, Assist Device grab bar  (Bedside commode)   Transfer Training Goal   Transfer Training Goal, Date Established 04/15/22   Transfer Training Goal, Time to Achieve by discharge   Transfer Training Goal, Activity Type all transfers   Transfer Training Goal, Independence Level stand-by assistance   Transfer Training Goal, Assist Device walker, rolling;tub bench;commode, bedside   Planned  Therapy Interventions, OT Eval   Planned Therapy Interventions ADL retraining;transfer training   Clinical Impression   Criteria for Skilled Therapeutic Interventions Met (OT) yes;meets criteria;skilled treatment is necessary   Rehab Potential good   Therapy Frequency minimum of 1x/week   Predicted Duration of Therapy until discharge   Anticipated Equipment Needs at Discharge to be determined   Anticipated Discharge Disposition home with assist   Evaluation Complexity Justification   Occupational Profile Review Brief history   Performance Deficits 3-5 deficits   Clinical Decision Making Low  analytic complexity   Evaluation Complexity Low       TREATMENT PLAN: ADL/IADL TRAINING, EDUCATION, and DME/AE TRAINING  EVALUATION COMPLEXITY: CLINICAL DECISION MAKING OF LOW COMPLEXITY AS INDICATED BY PMH, OCCUPATIONAL THERAPY ASSESSMENT OF MUSCULOSKELETAL AND NEUROLOGICAL SYSTEMS AND ACTIVITY LIMITATIONS. CLINICAL PRESENTATION IS STABLE AND UNCOMPLICATED.      EVALUATION 25 minutes    Therapist:      Geanie Logan, OT,04/15/2022 14:43

## 2022-04-15 NOTE — Progress Notes (Signed)
ORTHOPAEDIC SURGERY PROGRESS NOTE    Patient Name: Brandy Flores   MRN: [id number]   Date of Birth: June 11, 1956  Date: 04/15/2022  Age: 66 y.o.    Gender: female  Attending Physician: Delight Ovens, MD      Subjective:    HPI:  [name] is a 66 y.o. female who status post cannulated screw fixation left hip.    Objective:    Last Filed Vitals:  Filed Vitals:    04/15/22 0406 04/15/22 0702 04/15/22 1021 04/15/22 1200   BP: 133/69 127/72  120/71   Pulse: 81 68 77 81   Resp: 16      Temp: 36.2 C (97.2 F) 36.1 C (97 F)  36.1 C (97 F)   SpO2: 94% 95%  98%        Musculoskeletal Exam:     EXAMINATION OF THE OPERATIVE EXTREMITY DEMONSTRATES incision healing nicely.  Thigh soft.    Radiographs:   @RADRESULTS2DAYS @    Lab Data   Lab Results   Component Value Date    WBC 5.6 04/15/2022    HGB 12.8 04/15/2022    HCT 39.0 04/15/2022     Lab Results   Component Value Date    APTT 39.4 (H) 04/13/2022    INR 1.04 04/13/2022     Of    Microbiology:      URINE CULTURE   Date Value Ref Range Status   04/13/2022 (A)  Preliminary    >100,000 CFU/mL Non-Lactose Fermenting Gram Negative Rod     INFLUENZA VIRUS TYPE A   Date Value Ref Range Status   04/13/2022 Not Detected Not Detected Final     INFLUENZA VIRUS TYPE B   Date Value Ref Range Status   04/13/2022 Not Detected Not Detected Final           Impression and Plan:    Impression:   Status post cannulated screw fixation left hip     Plan:  Discharge planning    06/13/2022, DO  [facility]  Please call with any questions/concerns - [phone]    This note has been created with voice recognition software.  Please excuse any errors in transcription.  Occasional wrong word or sound alike substitutions may have occurred due to the inherent limitations of voice recognition software.  Please read the chart carefully and recognize using context with the substitutions may have occurred.

## 2022-04-15 NOTE — Nurses Notes (Signed)
Patient bedside glucose at 410, has regular Coca cola at bedside. Asked patient if we could substitue with diet coke and she refused. Asked if we could switch to water and she stated she chews ice chips and that was how she got her water.

## 2022-04-15 NOTE — Progress Notes (Signed)
[facility]  Progress Note    Brandy Flores  Date of service: 04/15/2022  Date of Admission:  04/13/2022  Hospital Day:  LOS: 2 days     Subjective:  Patient is seen in follow-up on 04/15/2022.  She is getting ready to get up with physical therapy this morning.  States that she is going to go home, does not want rehab.  Foley catheter is out, is voiding without difficulty.  Able to tolerate food.  No vomiting.    Review of Systems:    All systems were reviewed, pertinent positive and negative as mentioned in subjective.    Objective:      Vital Signs:  Vitals:    04/14/22 2352 04/15/22 0406 04/15/22 0702 04/15/22 1021   BP: 116/66 133/69 127/72    Pulse: (!) 108 81 68 77   Resp: 16 16     Temp: 36.4 C (97.6 F) 36.2 C (97.2 F) 36.1 C (97 F)    SpO2: 95% 94% 95%    Weight:       Height:       BMI:                I/O:  I/O last 24 hours:    Intake/Output Summary (Last 24 hours) at 04/15/2022 1150  Last data filed at 04/15/2022 0500  Gross per 24 hour   Intake 1000 ml   Output 820 ml   Net 180 ml           Physical Exam:    Constitutional:  Patient is lying in the bed, not in any distress appears comfortable.  HENT: Mucus membranes moist.  No oral ulcer.  Head: Normocephalic and atraumatic.   Cardiovascular:  S1-S2 audible,   Pulmonary/Chest:  Bilaterally air entry  Abdominal: Soft. Bowel sounds are normal. Patient exhibits no distension. There is no abdominal tenderness. There is no rebound and no guarding.   Musculoskeletal:  Dressing left hip     Neurological:  Patient is awake, alert, oriented X 3, cranial nerves intact, moving all extremities, nonfocal examination  Skin: Skin is warm and dry. No erythema.   Psychiatric: Affect and judgment normal.     acetaminophen (TYLENOL) tablet, 650 mg, Oral, Q4H PRN  aspirin chewable tablet 81 mg, 81 mg, Oral, 2x/day  atorvastatin (LIPITOR) tablet, 10 mg, Oral, QPM  bisacodyl (DULCOLAX) rectal suppository, 10 mg, Rectal, Daily PRN  [Held by provider]  chlorTHALIDONE (HYGROTON) tablet, 25 mg, Oral, Daily  cholecalciferol (VITAMIN D3) 1000 unit (25 mcg) tablet, 1,000 Units, Oral, Daily  dexamethasone (PF) 10 mg/mL injection, 8 mg, Intravenous, Q12H  dextrose 50% (0.5 g/mL) injection - syringe, 25 g, Intravenous, Q15 Min PRN   Or  dextrose 50% (0.5 g/mL) injection - syringe, 12.5 g, Intravenous, Q15 Min PRN   Or  glucagon (GLUCAGEN DIAGNOSTIC KIT) injection 1 mg, 1 mg, Subcutaneous, Q15 Min PRN   Or  glucagon (GLUCAGEN DIAGNOSTIC KIT) injection 1 mg, 1 mg, IntraMUSCULAR, Q15 Min PRN  docusate sodium (COLACE) capsule, 100 mg, Oral, 2x/day  famotidine (PEPCID) tablet, 20 mg, Oral, 2x/day  ferrous sulfate 324 mg (65 mg elemental IRON) tablet, 324 mg, Oral, Daily before Breakfast  HYDROmorphone (DILAUDID) 2 mg/mL injection, 0.2 mg, Intravenous, Q2H PRN  ketorolac (TORADOL) 30 mg/mL injection, 15 mg, Intravenous, Q8HRS  [Held by provider] lisinopril (PRINIVIL) tablet, 2.5 mg, Oral, Daily  LR premix infusion, , Intravenous, Continuous  morphine 2 mg/mL injection, 2 mg, Intravenous, Q2H PRN  multivitamin-iron-folic acid (CEROVITE  ADVANCED FORMULA) tablet, 1 Tablet, Oral, Daily  nalOXone (NARCAN) 0.4 mg/mL injection, 0.4 mg, Intravenous, Q2 MIN PRN  NS premix infusion, , Intravenous, Continuous  ondansetron (ZOFRAN) 2 mg/mL injection, 4 mg, Intravenous, Q4H PRN  oxyCODONE-acetaminophen (PERCOCET) 5-362m per tablet, 1 Tablet, Oral, Q4H PRN  oxyCODONE-acetaminophen (PERCOCET) 5-3235mper tablet, 2 Tablet, Oral, Q4H PRN  pantoprazole (PROTONIX) delayed release tablet, 40 mg, Oral, Daily  sodium zirconium cyclosilicate (LOKELMA) powder, 10 g, Oral, Daily  SSIP insulin R human (HumuLIN R) 100 units/mL injection, 0-12 Units, Subcutaneous, 4x/day PRN  traMADol (ULTRAM) tablet, 50 mg, Oral, Q6HRS      Labs:  Results for orders placed or performed during the hospital encounter of 04/13/22 (from the past 24 hour(s))   CBC/DIFF *Canceled*    Narrative    The following orders were  created for panel order CBC/DIFF.  Procedure                               Abnormality         Status                     ---------                               -----------         ------                       Please view results for these tests on the individual orders.   CBC/DIFF *Canceled*    Narrative    The following orders were created for panel order CBC/DIFF.  Procedure                               Abnormality         Status                     ---------                               -----------         ------                       Please view results for these tests on the individual orders.   CBC/DIFF    Narrative    The following orders were created for panel order CBC/DIFF.  Procedure                               Abnormality         Status                     ---------                               -----------         ------                     CBC WITH DITMLY[[id number]]              Abnormal  Final result                 Please view results for these tests on the individual orders.   BASIC METABOLIC PANEL, NON-FASTING   Result Value Ref Range    SODIUM 134 (L) 136 - 145 mmol/L    POTASSIUM 5.4 (H) 3.5 - 5.1 mmol/L    CHLORIDE 103 98 - 107 mmol/L    CO2 TOTAL 21 21 - 31 mmol/L    ANION GAP 10 4 - 13 mmol/L    CALCIUM 8.8 8.6 - 10.3 mg/dL    GLUCOSE 589 (HH) 74 - 109 mg/dL    BUN 19 7 - 25 mg/dL    CREATININE 1.15 0.60 - 1.30 mg/dL    BUN/CREA RATIO 17 6 - 22    ESTIMATED GFR 52 (L) >59 mL/min/1.6m2    OSMOLALITY, CALCULATED 298 (H) 270 - 290 mOsm/kg    Narrative    Estimated Glomerular Filtration Rate (eGFR) is calculated using the CKD-EPI (2021) equation, intended for patients 119years of age and older. If gender is not documented or "unknown", there will be no eGFR calculation.   MAGNESIUM   Result Value Ref Range    MAGNESIUM 1.9 1.9 - 2.7 mg/dL   CBC WITH DIFF   Result Value Ref Range    WBC 5.6 3.8 - 11.8 x10^3/uL    RBC 4.24 3.63 - 4.92 x10^6/uL    HGB 12.8 10.9 - 14.3 g/dL    HCT  39.0 31.2 - 41.9 %    MCV 91.8 75.5 - 95.3 fL    MCH 30.2 24.7 - 32.8 pg    MCHC 32.9 32.3 - 35.6 g/dL    RDW 14.0 12.3 - 17.7 %    PLATELETS 164 140 - 440 x10^3/uL    MPV 8.2 7.9 - 10.8 fL    NEUTROPHIL % 84 (H) 43 - 77 %    LYMPHOCYTE % 13 (L) 16 - 44 %    MONOCYTE % 3 (L) 5 - 13 %    EOSINOPHIL % 0 (L) %    BASOPHIL % 0 0 - 1 %    NEUTROPHIL # 4.70 1.85 - 7.80 x10^3/uL    LYMPHOCYTE # 0.70 (L) 1.00 - 3.00 x10^3/uL    MONOCYTE # 0.20 (L) 0.30 - 1.00 x10^3/uL    EOSINOPHIL # 0.00 0.00 - 0.50 x10^3/uL    BASOPHIL # 0.00 0.00 - 0.10 x10^3/uL   POC BLOOD GLUCOSE (RESULTS)   Result Value Ref Range    GLUCOSE, POC 201 50 - 500 mg/dl   POC BLOOD GLUCOSE (RESULTS)   Result Value Ref Range    GLUCOSE, POC 220 50 - 500 mg/dl   POC BLOOD GLUCOSE (RESULTS)   Result Value Ref Range    GLUCOSE, POC 589 (HH) 50 - 500 mg/dl   POC BLOOD GLUCOSE (RESULTS)   Result Value Ref Range    GLUCOSE, POC 488 50 - 500 mg/dl   POC BLOOD GLUCOSE (RESULTS)   Result Value Ref Range    GLUCOSE, POC 371 50 - 500 mg/dl        Imaging:    Results for orders placed or performed during the hospital encounter of 04/13/22 (from the past 24 hour(s))   FLUORO LOWER EXTREMITY IN OR LEFT     Status: None    Narrative    Brandy Flores      PROCEDURE DESCRIPTION: XR HIP    CLINICAL HISTORY: left hip pinning  COMPARISON:None          FINDINGS: 2 views of the left hip were obtained. The patient is status post placement of 3 orthopedic fixation screws across previously noted subcapital fracture of the proximal left femur. Please see the operative note for further detail.        Impression    Interval placement of 3 orthopedic fixation screws across the previously noted subcapital fracture of the proximal left femur is seen.        Radiologist location ID: [id number]         Microbiology:  Hospital Encounter on 04/13/22 (from the past 96 hour(s))   URINE CULTURE,ROUTINE    Collection Time: 04/13/22  9:45 PM    Specimen: Urine, Clean Catch   Culture Result  Status    URINE CULTURE (A) Preliminary     >100,000 CFU/mL Non-Lactose Fermenting Gram Negative Rod    Narrative    SENSITIVITY (MIC) TO FOLLOW             Assessment/ Plan:   Active Hospital Problems    Diagnosis    Primary Problem: Closed left hip fracture (CMS HCC)      1. Left hip fracture      -post ORIF left hip fracture      -patient will follow up with Orthopedics in 2 weeks outpatient.  Optimal pain control in place, PTOT consult.  Patient plans on going home at discharge with home health.  Case management is consulted   2. Acute kidney injury, resolved    DVT prophylaxis.  SCDs  Seen and examined along with attending physician.  The chart and plan of care have been reviewed and discussed in detail.      Disposition Planning:  Patient will go home with home health    Heather Redden, FNP-BC      This note was partially generated using MModal Fluency Direct system, and there may be some incorrect words, spellings, and punctuation that were not noted in checking the note before saving.

## 2022-04-15 NOTE — PT Evaluation (Signed)
[facility]  [address]  [place], [address]  [phone]  (Fax) [phone]  Rehabilitation Services  Physical Therapy Inpatient Initial Evaluation    Patient Name: Brandy Flores  Date of Birth: [birth year]  Height: Height: 162.6 cm (5\' 4" )  Weight: Weight: 74.2 kg (163 lb 8 oz)  Room/Bed: 360/A  Payor: AETNA-MEDI-ADV / Plan: A[employer] MEDICARE ADVANTAGE PPO / Product Type: PPO /       PMH:  Past Medical History:   Diagnosis Date    Arthropathy     Diabetes mellitus, type 2 (CMS HCC)     Esophageal reflux     HTN (hypertension)     Hypercholesterolemia     Perforation of tympanic membrane     Renal cyst            Assessment:      (P) Patient was admitted with a left hip fx which was repaired with ORIF on 04/14/22. She has WBAT status LLE. She denied any pain throughout evaluation. She is highly motivated and great self motivator. She completed bed mobility, transfers, ambulation and stair training. She will benefit from continued PT services throughout her stay to improve ambulation distance, trial SPC as indicated, improve LLE strength and improve safety. She plans to return to home with assist of boyfriend and family, and has no concerns.    Discharge Needs:    Equipment Recommendation: (P) front wheeled walker, standard cane      The patient presents with mobility limitations due to impaired strength and safety  that significantly impair/prevent patient's ability to participate in mobility-related activities of daily living (MRADLs) including  ambulation and transfers in order to safely complete, toileting, bathing, food preparation, laundering/household tasks, safely entering/exiting the home, babysitting duties . This functional mobility deficit can be improved with the use of a (P) front wheeled walker, standard cane  in order to decrease the risk of falls in performance of these MRADLs.  Patient is able to safely use this assistive device.    Discharge Disposition:  (P) home with assist    JUSTIFICATION OF DISCHARGE RECOMMENDATION   Based on current diagnosis, functional performance prior to admission, and current functional performance, this patient requires continued PT services in (P) home with assist in order to achieve significant functional improvements in these deficit areas: (P) gait, locomotion, and balance, muscle performance.        Plan:   Current Intervention: (P) gait training, home exercise program, patient/family education, strengthening, stretching, transfer training, stair training  To provide physical therapy services (P) other (see comments) (1-3x/day at least one day weekly)  for duration of (P) until discharge.    The risks/benefits of therapy have been discussed with the patient/caregiver and he/she is in agreement with the established plan of care.       Subjective & Objective     Past Medical History:   Diagnosis Date    Arthropathy     Diabetes mellitus, type 2 (CMS HCC)     Esophageal reflux     HTN (hypertension)     Hypercholesterolemia     Perforation of tympanic membrane     Renal cyst             Past Surgical History:   Procedure Laterality Date    ANTERIOR CERVICAL DISCECTOMY W/ FUSION      HX CESAREAN SECTION      3    HX EAR SURGERY      KNEE  ARTHROSCOPY Right         04/15/22 0929   Rehab Session   Document Type evaluation   Patient Effort excellent   General Information   Patient Profile Reviewed yes   Pertinent History of Current Functional Problem Patient to ED on 04/13/22 s/p a fall landing on her left hip with immediate pain. Xray in ED shows an acute left femoral subcapital neck fracture. She was also noted to have an AKI and hyperglycemia. She underwent an ORIF with screws on 04/14/22. Order received for PT evaluation.   Medical Lines PIV Line   Respiratory Status nasal cannula   Existing Precautions/Restrictions weight bearing restriction;fall precautions  (WBAT LLE)   Mutuality/Individual Preferences   Anxieties, Fears or Concerns no  concerns stated   Individualized Care Needs Physical Therapy   Living Environment   Lives With alone  (has a boyfriend who is around alot and can stay to assist her as needed.)   Living Arrangements house   Home Accessibility stairs to enter home   Home Main Entrance   Stair Railings, Main Entrance railing on left side (ascending)   Number of Stairs, Main Entrance seven   Functional Level Prior   Ambulation 0 - independent   Transferring 0 - independent   Toileting 0 - independent   Bathing 0 - independent   Dressing 0 - independent   Eating 0 - independent   Communication 0 - understands/communicates without difficulty   Prior Functional Level Comment Patient states that she was completely independent in all functional activity prior the fall. 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 She drives and completes all community tasks.   Pre Treatment Status   Pre Treatment Patient Status Patient supine in bed   Support Present Pre Treatment  None   Communication Pre Treatment  Charge Nurse   Communication Pre Treatment Comment cleared for PT eval   Cognitive Assessment/Interventions   Behavior/Mood Observations alert;cooperative   Orientation Status oriented x 4   Attention WNL/WFL   Follows Commands WNL   Pre- Treatment Vital Signs   Pre-Treatment Heart Rate (beats/min) 89   Pre SpO2 (%) 95   O2 Delivery Pre Treatment supplemental O2   Vitals Comment Was asked by nurse to ambulate without O2 to monitor her O2 saturation for weaning purposes. She did desaturate to 88% after walking a good distance and ascending/descending 4 stairs. Returned to 94% within 1 minute of O2 being reapplied at 2 L/min after assessment.   Pre-Treatment Pain   Pretreatment Pain Rating 0/10 - no pain   Pre/Posttreatment Pain Comment denies having any pain, even with WB and ambulation   RUE Assessment   RUE Assessment WFL- Within Functional Limits   LUE Assessment   LUE Assessment WFL- Within Functional Limits   RLE Assessment   RLE Assessment  WFL- Within Functional Limits   LLE Assessment   LLE Assessment X-Exceptions   LLE ROM WFL   LLE Strength 4-/5   Trunk Assessment   Trunk Assessment WFL-Within Functional Limits   Mobility Assessment/Training   Additional Documentation Bed Mobility Assessment/Treatment (Group);Transfer Assessment/Treatment (Group);Gait Assessment/Treatment (Group);Stairs Assessment/Treatment (Group)   Bed Mobility Assessment/Treatment   Bed Mobility, Assistive Device bed rails   Roll Left Independence independent   Roll Right Independence independent   Scoot/Bridge Independence supervision required   Sit to Supine, Independence supervision required   Supine-Sit Independence supervision required   Transfer Assessment/Treatment   Transfer Comment min cues to push from behind and reach  back when sitting instead of holding to walker for transitions   Sit-Stand Independence stand-by assistance   Stand-Sit Independence stand-by assistance   Gait Assessment/Treatment   Total Distance Ambulated 240   Assistive Device  walker, front wheeled   Comment min cues for safety, can be mildly impulsive at times   Distance in Feet 240 feet   Independence  stand-by assistance   Stairs Assessment/Treatment   Number of Stairs 4   Comment Patient was instructed in ascending steps leading with her nonsurgical leg and descending with surgical leg first, however, patient states that her nonsurgical leg is weak and she feels better and more confident in her surgical leg to lead up and the nonsurgical leg to lead down the stairs. She completed the stairs without incident doing i the way she feels more comfortable and did not have pain in the left hip.   Handrail Location both sides   Independence Level stand-by assistance   Technique Used step to step (ascending);step to step (descending)   Post Treatment Status   Post Treatment Patient Status Patient sitting in bedside chair or w/c;Call light within reach;Patient safety alarm activated   Support Present Post  Treatment  None   Communication Post Treatement Nurse   Communication Post Treatment Comment updated on her status   Post-Treatment Vital Signs   Post-treatment Heart Rate (beats/min) 83   Post SpO2 (%) 88   O2 Delivery Post Treatment room air  (O2 was reapplied and increased up to 94% in less than 1 minute)   Post-Treatment Pain   Posttreatment Pain Rating 0/10 - no pain   Physical Therapy Clinical Impression   Assessment Patient was admitted with a left hip fx which was repaired with ORIF on 04/14/22. She has WBAT status LLE. She denied any pain throughout evaluation. She is highly motivated and great self motivator. She completed bed mobility, transfers, ambulation and stair training. She will benefit from continued PT services throughout her stay to improve ambulation distance, trial SPC as indicated, improve LLE strength and improve safety. She plans to return to home with assist of boyfriend and family, and has no concerns.   Criteria for Skilled Therapeutic meets criteria   Impairments Found (describe specific impairments) gait, locomotion, and balance;muscle performance   Functional Limitations in Following  self-care;home management;community/leisure   Rehab Potential good   Therapy Frequency other (see comments)  (1-3x/day at least one day weekly)   Predicted Duration of Therapy Intervention (days/wks) until discharge   Anticipated Equipment Needs at Discharge (PT) front wheeled walker;standard cane   Anticipated Discharge Disposition home with assist   Evaluation Complexity Justification   Patient History: Co-morbidity/factors that impact Plan of Care 0 none that impact Plan of Care   Examination Components 1-2 Exam elements addressed   Presentation Stable: Uncomplicated, straight-forward, problem focused   Clinical Decision Making Low complexity   Evaluation Complexity Low complexity   Care Plan Goals   PT Rehab Goals Bed Mobility Goal;Gait Training Goal;Strength Goal;Transfer Training Goal   Planned  Therapy Interventions, PT Eval   Planned Therapy Interventions (PT) gait training;home exercise program;patient/family education;strengthening;stretching;transfer training;stair training   Transfer Training Goal   Transfer Training Goal, Date Established 04/15/22   Transfer Training Goal, Time to Achieve by discharge   Transfer Training Goal, Activity Type all transfers   Transfer Training Goal, Independence Level supervision required   Transfer Training Goal, Assist Device least restrictrictive assistive device   Gait Training  Goal, Distance to Achieve   Gait  Training  Goal, Date Established 04/15/22   Gait Training  Goal, Time to Achieve by discharge   Gait Training  Goal, Independence Level supervision required   Gait Training  Goal, Assist Device least restricted assistive device   Gait Training  Goal, Distance to Achieve 600 feet   Bed Mobility Goal   Bed Mobility Goal, Date Established 04/15/22   Bed Mobility Goal, Time to Achieve by discharge   Bed Mobility Goal, Activity Type all bed mobility activities   Bed Mobility Goal, Independence Level independent   Strength Goal   Strength Goal, Date Established 04/15/22   Strength Goal, Time to Achieve by discharge   Strength Goal, Measure to Achieve Patient will acheive strength LLE to 4+/5 to aid transfers and gait with decreased fall risk.   Physical Therapy Time and Intention   Total PT Minutes: 34               INTERVENTION MINUTES: EVALUATION 16 minutes and GAIT TRAINING 18 MINUTES    EVALUATION COMPLEXITY : CLINICAL DECISION MAKING OF LOW COMPLEXITY AS INDICATED BY PMH, PHYSICAL THERAPY ASSESSMENT OF MUSCULOSKELETAL AND NEUROLOGICAL SYSTEMS AND ACTIVITY LIMITATIONS. CLINICAL PRESENTATION IS STABLE AND UNCOMPLICATED    Therapist:     Rollene Rotunda, PT  04/15/2022, 12:17

## 2022-04-15 NOTE — Discharge Summary (Signed)
[facility]     DISCHARGE SUMMARY      PATIENT NAME:  Brandy Flores   MRN:  [id number]  DOB:  [birth year]    INPATIENT ADMISSION DATE: 04/13/2022   DATE OF DISCHARGE:  04/15/22     ATTENDING PHYSICIAN: Nira Conn Kellis Mcadam, FNP-BC     PRIMARY CARE PHYSICIAN: No Pcp     HOSPITAL PRESENTATION:  Fall with injury    Please see full admission H&P for details.      As per JJH:ERDEYCX Brandy Flores is a 66 y.o., White female who presents to [facility] from [facility] for fall with injury.  Patient was seen examined at bedside, family present during exam.  Patient states that she was at [employer]ingo today and was shoveling some chairs whenever she lost her balance falling on her left hip.  Patient states she did not lose consciousness nor does she get dizzy before the fall.  Patient states she stumbled over the chair which caused her to fall.  Patient has history of arthritis.  Patient was found to have an acute left femoral subcapital neck fracture.  Patient also some AKI with a bump in the creatinine of 1.40.  It appears patient's baseline is around 0.9.  Patient denies any other complaints at this time.  Patient will be admitted to hospitalist services for further evaluation.   FURTHER HOSPITAL COURSE WITH DISCHARGE DIAGNOSES:  Patient had ORIF of left hip on 04/14/2022:  Tolerated surgery well.  PT OT consult.  Patient was able to ambulate in the hallway with assistance and a walker.  Stated that she wanted to go home instead of rehabilitation.  Case management working on arranging home health at home for the patient.  She is agreeable with the plan of care.  Creatinine improved to 1.15.  Patient did have some hyperglycemia, she received IV steroids x2, states that she is typically well-controlled at home.  Was covered with sliding scale coverage.  Denies any other complaints.  She will be cleared for discharge tomorrow 04/15/2022.          Problem List:  Active Hospital Problems   (*Primary Problem)    Diagnosis    *Closed  left hip fracture (CMS HCC)               PHYSICAL EXAM   DAY OF DISCHARGE:    BP 120/71   Pulse 81   Temp 36.1 C (97 F)   Resp 16   Ht 1.626 m ('5\' 4"' )   Wt 74.2 kg (163 lb 8 oz)   SpO2 98%   BMI 28.06 kg/m          General:  Patient is resting in bed, no acute distress, alert and oriented x3   Eyes:  PERRL, no scleral icterus   HENT:  Normocephalic, atraumatic, oral mucosa is moist and pink, no nasal discharge   Heart:  RRR, S1 and S2 auscultated, no murmurs appreciated   Lungs:  Unlabored respirations.  Lungs are clear to auscultation bilaterally, no wheezes, no rales  Abdomen:  Soft, active bowel sounds, non-tender to palpation, non-distended  Extremities:  Dressing to left hip intact   Skin:  Warm and dry.  Not diaphoretic  Neuro:  A&O x 3.  No focal deficits.  Speech intact.  Not tremulous  Psych:  Cooperative, not agitated    LABS:  CBC with Diff (Last 48 Hours):    Recent Results in last 48 hours     04/13/22  2101  04/15/22  0512   WBC 12.3* 5.6   HGB 15.1 12.8   HCT 44.5 39.0   MCV 89.4 91.8   PLTCNT 201 164   PMNS 74 84*   LYMPHOCYTES 18* 13*   MONOCYTES 6 3*   EOSINOPHIL 2 0*   BASOPHILS 0  0.03 0  0.00          Last BMP  (Last result in the past 48 hours)        Na   K   Cl   CO2   BUN   Cr   Calcium   Glucose   Glucose-Fasting        04/15/22 0512 134   5.4   103   21   19   1.15   8.8   589               Last Hepatic Panel  (Last result in the past 48 hours)        Albumin   Total PTN   Total Bili   Direct Bili   Ast/SGOT   Alt/SGPT   Alk Phos        04/13/22 2101 3.9   7.5   0.'5     23   24   ' 114                BMP (Last 48 Hours):    Recent Results in last 48 hours     04/13/22  2101 04/15/22  0512   SODIUM 136 134*   POTASSIUM 4.4 5.4*   CHLORIDE 100 103   CO2 25 21   BUN 15 19   CREATININE 1.40* 1.15   CALCIUM 9.8 8.8   GLUCOSENF 390* 589*          DISCHARGE MEDICATIONS:     Current Discharge Medication List        START taking these medications.        Details   aspirin 81 mg Tablet,  Chewable   81 mg, Oral, 2 TIMES DAILY  Qty: 28 Tablet  Refills: 0     cholecalciferol (vitamin D3) 25 mcg (1,000 unit) Tablet  Start taking on: April 16, 2022   1,000 Units, Oral, DAILY  Qty: 30 Tablet  Refills: 0            CONTINUE these medications which have CHANGED during your visit.        Details   HYDROcodone-acetaminophen 10-325 mg Tablet  Commonly known as: [place]  What changed: Another medication with the same name was removed. Continue taking this medication, and follow the directions you see here.   1 Tablet, Oral, EVERY 6 HOURS PRN  Refills: 0            CONTINUE these medications - NO CHANGES were made during your visit.        Details   chlorTHALIDONE 25 mg Tablet  Commonly known as: HYGROTON   25 mg, Oral, DAILY  Refills: 0     glipiZIDE 5 mg Tablet  Commonly known as: GLUCOTROL   10 mg, Oral, EVERY MORNING BEFORE BREAKFAST, Take 30 minutes before meals  Refills: 0     Jardiance 10 mg Tablet  Generic drug: empagliflozin   10 mg, Oral, DAILY  Refills: 0     lisinopriL 2.5 mg Tablet  Commonly known as: PRINIVIL   2.5 mg, Oral, DAILY  Refills: 0     MetFORMIN 1,000 mg Tablet  Commonly known as:  GLUCOPHAGE   1,000 mg, Oral, 2 TIMES DAILY WITH FOOD  Refills: 0     nitroGLYCERIN 0.4 mg Tablet, Sublingual  Commonly known as: NITROSTAT   0.4 mg, Sublingual, EVERY 5 MIN PRN, for 3 doses over 15 minutes  Refills: 0     pantoprazole 40 mg Tablet, Delayed Release (E.C.)  Commonly known as: PROTONIX   40 mg, Oral, DAILY  Refills: 0     simvastatin 20 mg Tablet  Commonly known as: ZOCOR   20 mg, Oral, EVERY EVENING  Refills: 0            STOP taking these medications.      Ativan 0.5 mg Tablet  Generic drug: LORazepam     Prevacid 30 mg Capsule, Delayed Release(E.C.)  Generic drug: lansoprazole                 DISCHARGE DISPOSITION:  Home with home health    DISCHARGE INSTRUCTIONS:   ADA diet       PT EVALUATE AND TREAT- OUTPATIENT     Need to be addressed: EVALUATE AND TREAT    Reason: post op      DME -  [facility]    Please note - If patient is 300 lbs or greater please order bariatric or heavy duty items.     Patient has mobility limits that significantly impairs ability to participate in one or more mobility related ADL's (MRADL's): Yes    Moblity Limitations: Pt at heightened risk of injury r/t attempts to fulfill MRADL's & can safely use walker which resolves issue    Walker Type: Front Wheeled    Freedom of Choice: I have informed patient of their freedom of choice with respect to DME providers      DME - BEDSIDE COMMODE    A standard commode is covered when the patient is physically incapable of utilizing regular toilet facilities.  An extra wide/heavy duty commode chair is covered for a patient who weighs 300 pounds or more.     I certify that a bedside commode is necessary due to Patient is confined to a single room    Bedside Commode Type Standard Bedside Commode    Freedom of Choice: I have informed patient of their freedom of choice with respect to DME providers      DME - [facility]    Please note - If patient is 300 lbs or greater please order bariatric or heavy duty items.     Patient has mobility limits that significantly impairs ability to participate in one or more mobility related ADL's (MRADL's): Yes    Moblity Limitations: Pt at heightened risk of injury r/t attempts to fulfill MRADL's & can safely use walker which resolves issue    Walker Type: Front Wheeled    Freedom of Choice: I have informed patient of their freedom of choice with respect to DME providers      DME - BEDSIDE COMMODE    A standard commode is covered when the patient is physically incapable of utilizing regular toilet facilities.  An extra wide/heavy duty commode chair is covered for a patient who weighs 300 pounds or more.     I certify that a bedside commode is necessary due to Patient is confined to a single room    Bedside Commode Type Standard Bedside Commode    Freedom of Choice: I have informed  patient of their freedom of choice with respect to DME providers  Copies sent to Care Team         Relationship Specialty Notifications Start End    Pcp, No PCP - General   03/17/22             >30 minutes total were spent coordinating discharge day today  Patient was seen and examined along with attending physician.  The chart and plan of care have been reviewed and discussed in detail.      Donalynn Furlong, FNP-BC  [facility] HOSPITALIST

## 2022-04-15 NOTE — PT Treatment (Signed)
[facility] Medicine [facility]  [address]  [place], [address]  [phone]  (Fax) [phone]  Rehabilitation Department  Physical Therapy Daily Inpatient Note    Date: 04/15/2022  Patient's Name: Brandy Flores  Date of Birth: 06/28/1956  Height: Height: 162.6 cm (5\' 4" )  Weight: Weight: 74.2 kg (163 lb 8 oz)      Plan: Will continue under current POC.         Subjective/Objective/Assessment:  Flowsheet    04/15/22 1300   Rehab Session   Document Type therapy progress note (daily note)   Patient Effort good   General Information   Patient Profile Reviewed yes   Medical Lines PIV Line   Respiratory Status nasal cannula   Existing Precautions/Restrictions fall precautions   Pre Treatment Status   Pre Treatment Patient Status Patient supine in bed   Support Present Pre Treatment  None   Communication Pre Treatment  Charge Nurse   Communication Pre Treatment Comment cleared for PT   Pre- Treatment Vital Signs   Pre-Treatment Heart Rate (beats/min) 98   Pre SpO2 (%) 90   O2 Delivery Pre Treatment supplemental O2   Pre-Treatment Pain   Pretreatment Pain Rating 3/10   Bed Mobility Assessment/Treatment   Supine-Sit Independence stand-by assistance   Sit to Supine, Independence stand-by assistance   Bed Mobility, Assistive Device bed rails   Transfer Assessment/Treatment   Sit-Stand Independence stand-by assistance   Stand-Sit Independence stand-by assistance   Gait Assessment/Treatment   Total Distance Ambulated 340   Independence  stand-by assistance   Assistive Device  walker, front wheeled   Comment good step thru gait, no lob noted   Balance Skill Training   Sitting Balance: Static good balance   Sitting, Dynamic (Balance) fair + balance   Sit-to-Stand Balance fair + balance   Standing Balance: Static fair + balance   Post Treatment Status   Post Treatment Patient Status Patient supine in bed   Support Present Post Treatment  None   Post-Treatment Vital Signs   Post-treatment Heart Rate  (beats/min) 115   Post SpO2 (%) 82   O2 Delivery Post Treatment room air   Post-Treatment Pain   Posttreatment Pain Rating 2/10   Cognitive Assessment/Intervention   Behavior/Mood Observations behavior appropriate to situation, WNL/WFL   Physical Therapy Time and Intention   Total PT Minutes: 11     Patient found in the bed, transferred supine to sit with sba ,standing with sba, gaited with rw 370ft, good step thru gait,no lob, btb with sba/cga, needs in reach, bed alarmed            Intervention minutes: GAIT TRAINING [address] MINUTES    THERAPIST  [address], PTA  04/15/2022, 14:32

## 2022-04-16 ENCOUNTER — Inpatient Hospital Stay (HOSPITAL_COMMUNITY): Payer: Medicare PPO

## 2022-04-16 DIAGNOSIS — N390 Urinary tract infection, site not specified: Secondary | ICD-10-CM

## 2022-04-16 DIAGNOSIS — Z9889 Other specified postprocedural states: Secondary | ICD-10-CM

## 2022-04-16 DIAGNOSIS — S72002A Fracture of unspecified part of neck of left femur, initial encounter for closed fracture: Principal | ICD-10-CM

## 2022-04-16 DIAGNOSIS — B9620 Unspecified Escherichia coli [E. coli] as the cause of diseases classified elsewhere: Secondary | ICD-10-CM

## 2022-04-16 LAB — POC BLOOD GLUCOSE (RESULTS)
GLUCOSE, POC: 191 mg/dl (ref 50–500)
GLUCOSE, POC: 312 mg/dl (ref 50–500)
GLUCOSE, POC: 286 mg/dl (ref 50–500)
GLUCOSE, POC: 330 mg/dl (ref 50–500)

## 2022-04-16 LAB — CBC
HCT: 35.7 % (ref 31.2–41.9)
HGB: 12.2 g/dL (ref 10.9–14.3)
MCH: 30.7 pg (ref 24.7–32.8)
MCHC: 34.1 g/dL (ref 32.3–35.6)
MCV: 90.1 fL (ref 75.5–95.3)
MPV: 7.9 fL (ref 7.9–10.8)
PLATELETS: 166 10*3/uL (ref 140–440)
RBC: 3.97 10*6/uL (ref 3.63–4.92)
RDW: 14 % (ref 12.3–17.7)
WBC: 9.5 10*3/uL (ref 3.8–11.8)

## 2022-04-16 LAB — URINE CULTURE,ROUTINE: URINE CULTURE: >100000 — AB

## 2022-04-16 LAB — BASIC METABOLIC PANEL
ANION GAP: 6 mmol/L (ref 4–13)
BUN/CREA RATIO: 19 (ref 6–22)
BUN: 23 mg/dL (ref 7–25)
CALCIUM: 8.8 mg/dL (ref 8.6–10.3)
CHLORIDE: 107 mmol/L (ref 98–107)
CO2 TOTAL: 25 mmol/L (ref 21–31)
CREATININE: 1.2 mg/dL (ref 0.60–1.30)
ESTIMATED GFR: 50 mL/min/{1.73_m2} — ABNORMAL LOW (ref >59)
GLUCOSE: 207 mg/dL — ABNORMAL HIGH (ref 74–109)
OSMOLALITY, CALCULATED: 286 mOsm/kg (ref 270–290)
POTASSIUM: 4.4 mmol/L (ref 3.5–5.1)
SODIUM: 138 mmol/L (ref 136–145)

## 2022-04-16 MED ORDER — SODIUM CHLORIDE 0.9 % INTRAVENOUS PIGGYBACK
1.0000 g | INTRAVENOUS | Status: DC
Start: 2022-04-16 — End: 2022-04-16

## 2022-04-16 MED ORDER — FUROSEMIDE 10 MG/ML INJECTION SOLUTION
40.0000 mg | Freq: Once | INTRAMUSCULAR | Status: AC
Start: 2022-04-16 — End: 2022-04-16
  Administered 2022-04-16: 40 mg via INTRAVENOUS
  Filled 2022-04-16: qty 4

## 2022-04-16 MED ORDER — SODIUM CHLORIDE 0.9 % INTRAVENOUS PIGGYBACK
1.0000 g | INTRAVENOUS | Status: DC
Start: 2022-04-16 — End: 2022-04-17
  Administered 2022-04-16: 1 g via INTRAVENOUS
  Administered 2022-04-16 – 2022-04-17 (×2): 0 g via INTRAVENOUS
  Administered 2022-04-17: 1 g via INTRAVENOUS
  Filled 2022-04-16 (×2): qty 10

## 2022-04-16 NOTE — PT Treatment (Signed)
[facility]  [address]  [place], [address]  [phone]  (Fax) [phone]  Rehabilitation Department  Physical Therapy Daily Inpatient Note    Date: 04/16/2022  Patient's Name: Brandy Flores  Date of Birth: 1955/09/19  Height: Height: 162.6 cm (5\' 4" )  Weight: Weight: 74.2 kg (163 lb 8 oz)      Plan: Will continue under current POC.         Subjective/Objective/Assessment:  Flowsheet    04/16/22 1025   Rehab Session   Document Type therapy progress note (daily note)   Patient Effort good   General Information   Patient Profile Reviewed yes   Medical Lines PIV Line   Respiratory Status nasal cannula   Existing Precautions/Restrictions fall precautions   Pre Treatment Status   Pre Treatment Patient Status Patient supine in bed   Support Present Pre Treatment  None   Communication Pre Treatment  Charge Nurse   Communication Pre Treatment Comment cleared for pt   Pre- Treatment Vital Signs   Pre SpO2 (%) 83   O2 Delivery Pre Treatment room air   Vitals Comment was asked by nursing to use oxygen walking   Pre-Treatment Pain   Pretreatment Pain Rating 0/10 - no pain   Bed Mobility Assessment/Treatment   Supine-Sit Independence stand-by assistance   Sit to Supine, Independence stand-by assistance   Bed Mobility, Assistive Device bed rails   Transfer Assessment/Treatment   Sit-Stand Independence stand-by assistance   Stand-Sit Independence stand-by assistance   Sit-Stand-Sit, Assist Device walker, front wheeled   Gait Assessment/Treatment   Total Distance Ambulated 340   Independence  stand-by assistance   Assistive Device  walker, front wheeled   Comment good step thru gait, no lob,   Balance Skill Training   Sitting Balance: Static good balance   Sitting, Dynamic (Balance) fair + balance   Sit-to-Stand Balance fair + balance   Standing Balance: Static fair + balance   Post Treatment Status   Post Treatment Patient Status Patient supine in bed   Support Present Post Treatment   Nurse present   Post-Treatment Pain   Posttreatment Pain Rating 0/10 - no pain   Cognitive Assessment/Intervention   Behavior/Mood Observations behavior appropriate to situation, WNL/WFL   Physical Therapy Time and Intention   Total PT Minutes: 12     Patient gaited with rw 319ft, good step thru gait, no lob, upon returning to the bed, patient nauseated vomited, nsg aware as well.. bed alarmed, oxygen sat was 85 walking with oxygen on 2 liters, charge nurse aware            Intervention minutes: GAIT TRAINING 12 MINUTES    THERAPIST  46f, PTA  04/16/2022, 12:31

## 2022-04-16 NOTE — Nurses Notes (Signed)
Patient room air sat resting in bed 83%. O2 at 2L via NC reapplied. Sat returns to mid 90's. Patient denies any needs at this time. Therapy in to work with patient. Dr. Matilde Bash notified.

## 2022-04-16 NOTE — Progress Notes (Signed)
ORTHOPAEDIC SURGERY PROGRESS NOTE    Patient Name: Brandy Flores   MRN: [id number]   Date of Birth: 1955/10/30  Date: 04/16/2022  Age: 66 y.o.    Gender: female  Attending Physician: Delight Ovens, MD      Subjective:    HPI:  Brandy Flores is a 66 y.o. female who status post cannulated screw fixation left hip - DOS 04/14/22    Seen and evaluated on rounds.  No acute overnight events.  Patient states she is working well physical therapy and believe she is ready for discharge    Objective:    Last Filed Vitals:  Filed Vitals:    04/15/22 1859 04/15/22 2207 04/15/22 2338 04/16/22 0658   BP: 106/83  118/76 105/68   Pulse: 85 84 76 70   Resp: 16  16    Temp: 36.6 C (97.9 F)  36.3 C (97.4 F) 36.1 C (97 F)   SpO2: 96%  96% 96%        Musculoskeletal Exam:     EXAMINATION OF THE OPERATIVE EXTREMITY DEMONSTRATES:    Dressing clean dry and intact with no evidence of strike through drainage.  Appropriately tender palpation about the hip.  Motor and sensation grossly intact.  Compartments soft compressible.  Negative Homan or evidence of DVT.  Foot warm and well perfused    Lab Data   Lab Results   Component Value Date    WBC 9.5 04/16/2022    HGB 12.2 04/16/2022    HCT 35.7 04/16/2022     Lab Results   Component Value Date    APTT 39.4 (H) 04/13/2022    INR 1.04 04/13/2022     Of    Microbiology:      URINE CULTURE   Date Value Ref Range Status   04/13/2022 >100,000 CFU/mL Escherichia coli (A)  Final     INFLUENZA VIRUS TYPE A   Date Value Ref Range Status   04/13/2022 Not Detected Not Detected Final     INFLUENZA VIRUS TYPE B   Date Value Ref Range Status   04/13/2022 Not Detected Not Detected Final           Impression and Plan:    Impression:   Status post cannulated screw fixation left hip     Plan:  Discharge planning  Analgesia  DVT prophylaxis  Follow-up in the orthopedics clinic  Weightbear as tolerated    Rufina Falco, DO  [facility]  Please call with any questions/concerns     This  note has been created with voice recognition software.  Please excuse any errors in transcription.  Occasional wrong word or sound alike substitutions may have occurred due to the inherent limitations of voice recognition software.  Please read the chart carefully and recognize using context with the substitutions may have occurred.

## 2022-04-16 NOTE — PT Treatment (Signed)
[facility]  [address]  [place], [address]  [phone]  (Fax) [phone]  Rehabilitation Department  Physical Therapy Daily Inpatient Note    Date: 04/16/2022  Patient's Name: Brandy Flores  Date of Birth: 12/22/1955  Height: Height: 162.6 cm (5\' 4" )  Weight: Weight: 74.2 kg (163 lb 8 oz)      Plan: Will continue under current POC.         Subjective/Objective/Assessment:  Flowsheet    04/16/22 1415   Rehab Session   Document Type therapy progress note (daily note)   Patient Effort good   General Information   Patient Profile Reviewed yes   Medical Lines PIV Line   Respiratory Status nasal cannula   Existing Precautions/Restrictions fall precautions   Pre Treatment Status   Pre Treatment Patient Status Patient supine in bed   Support Present Pre Treatment  None   Communication Pre Treatment  Charge Nurse   Communication Pre Treatment Comment cleared for PT   Pre- Treatment Vital Signs   Pre-Treatment Heart Rate (beats/min) 93   Pre SpO2 (%) 97   O2 Delivery Pre Treatment supplemental O2   Vitals Comment 1L of O2   Pre-Treatment Pain   Pretreatment Pain Rating 0/10 - no pain   Bed Mobility Assessment/Treatment   Supine-Sit Independence modified independence   Transfer Assessment/Treatment   Sit-Stand Independence stand-by assistance   Stand-Sit Independence stand-by assistance   Sit-Stand-Sit, Assist Device walker, front wheeled   Gait Assessment/Treatment   Total Distance Ambulated 340   Independence  stand-by assistance   Assistive Device  walker, front wheeled   Comment good step thru gait, no lob noted   Balance Skill Training   Sitting Balance: Static good balance   Sitting, Dynamic (Balance) good balance   Sit-to-Stand Balance good balance   Standing Balance: Static good balance   Post Treatment Status   Post Treatment Patient Status Patient sitting in bedside chair or w/c   Support Present Post Treatment  None   Communication Post Treatment Comment chair alarmed    Post-Treatment Vital Signs   Post-treatment Heart Rate (beats/min) 105   Post SpO2 (%) 86   O2 Delivery Post Treatment supplemental O2   Post-Treatment Pain   Posttreatment Pain Rating 0/10 - no pain   Cognitive Assessment/Intervention   Behavior/Mood Observations behavior appropriate to situation, WNL/WFL   Physical Therapy Time and Intention   Total PT Minutes: 10     Patient transferred supine to sit modified independent, standing with sba, gaited with rw 364ft, good step thru gait, no lob, left upin the chair with needs in reach, chair alarmed as well            Intervention minutes: GAIT TRAINING 10 MINUTES    THERAPIST  46f, PTA  04/16/2022, 14:52

## 2022-04-16 NOTE — Progress Notes (Signed)
[facility] Hospitalist Progress Note    04/16/2022  11:05  Brandy Flores  [id number]  LOS: 3     History: Left hip fracture, s/p ORIF 04/14/22    Today: Patient sitting up in bed in no apparent distress. Denies any new complaints at this time. No overnight events reported. Denies any dysuria, pelvic/abdominal pain, or urinary frequency.     Review of systems:  10 point review of systems was reviewed and negative except as pertinent negatives and positives mentioned in the interval history above.    Past Medical History  HYDROcodone-acetaminophen, Hydrocodone-Acetaminophen, LORazepam, MetFORMIN, aspirin, chlorTHALIDONE, cholecalciferol (vitamin D3), empagliflozin, glipiZIDE, lansoprazole, lisinopriL, nitroGLYCERIN, pantoprazole, and simvastatin   Allergies   Allergen Reactions    Augmentin [Amoxicillin-Pot Clavulanate] Itching    Bactrim [Sulfamethoxazole-Trimethoprim] Itching    Morphine Nausea/ Vomiting    Tylenol-Codeine #3 [Acetaminophen-Codeine] Nausea/ Vomiting     Past Medical History:   Diagnosis Date    Arthropathy     Diabetes mellitus, type 2 (CMS HCC)     Esophageal reflux     HTN (hypertension)     Hypercholesterolemia     Perforation of tympanic membrane     Renal cyst          Past Surgical History:   Procedure Laterality Date    ANTERIOR CERVICAL DISCECTOMY W/ FUSION      HX CESAREAN SECTION      3    HX EAR SURGERY      KNEE ARTHROSCOPY Right          Family Medical History:       Problem Relation (Age of Onset)    Breast Cancer Sister    Cancer Mother    Diabetes Father    No Known Problems Brother, Maternal Aunt, Maternal Uncle, Paternal 28, Paternal Uncle, Maternal Grandmother, Maternal Grandfather, Paternal 24, Paternal 63, Daughter, Son, Other            Social History     Socioeconomic History    Marital status: Married   Tobacco Use    Smoking status: Never     Passive exposure: Never    Smokeless tobacco: Never   Vaping Use    Vaping Use: Never used   Substance and Sexual Activity     Alcohol use: Not Currently    Drug use: Never    Sexual activity: Not Currently       Physical exam:  BP 129/84   Pulse 80   Temp 36.6 C (97.9 F)   Resp 16   Ht 1.626 m (_0 )   Wt 74.2 kg (163 lb 8 oz)   SpO2 96%   BMI 28.06 kg/m       Gen: This is a 66 y.o. female who is awake alert and oriented x3 in no acute distress  CV:  Regular rate and rhythm without murmurs rubs or gallops.  2+ pedal and radial pulses. No clubbing, cyanosis, or edema.  RESP:  Clear to auscultation bilaterally without wheezes rales or rhonchi.  Respirations are nonlabored.  GI:  Abdomen is soft, nontender, nondistended.   MSK:  Full range of motion of upper and lower extremities  NEURO:  Cranial nerves 2-12 grossly intact.  No focal deficits as tested.  SKIN: Warm, dry, intact, without lesions, rashes, or ulcerations    Diagnostic Labs:  Results for orders placed or performed during the hospital encounter of 04/13/22 (from the past 24 hour(s))   POTASSIUM   Result Value Ref Range  POTASSIUM 4.7 3.5 - 5.1 mmol/L   CBC - AM ONCE   Result Value Ref Range    WBC 9.5 3.8 - 11.8 x10^3/uL    RBC 3.97 3.63 - 4.92 x10^6/uL    HGB 12.2 10.9 - 14.3 g/dL    HCT 35.7 31.2 - 41.9 %    MCV 90.1 75.5 - 95.3 fL    MCH 30.7 24.7 - 32.8 pg    MCHC 34.1 32.3 - 35.6 g/dL    RDW 14.0 12.3 - 17.7 %    PLATELETS 166 140 - 440 x10^3/uL    MPV 7.9 7.9 - 10.8 fL    Narrative    [id number] ON [id number] IN 9643/8   BASIC METABOLIC PANEL - AM ONCE   Result Value Ref Range    SODIUM 138 136 - 145 mmol/L    POTASSIUM 4.4 3.5 - 5.1 mmol/L    CHLORIDE 107 98 - 107 mmol/L    CO2 TOTAL 25 21 - 31 mmol/L    ANION GAP 6 4 - 13 mmol/L    CALCIUM 8.8 8.6 - 10.3 mg/dL    GLUCOSE 207 (H) 74 - 109 mg/dL    BUN 23 7 - 25 mg/dL    CREATININE 1.20 0.60 - 1.30 mg/dL    BUN/CREA RATIO 19 6 - 22    ESTIMATED GFR 50 (L) >59 mL/min/1.69m2    OSMOLALITY, CALCULATED 286 270 - 290 mOsm/kg    Narrative    Estimated Glomerular Filtration Rate (eGFR) is calculated using the CKD-EPI  (2021) equation, intended for patients 145years of age and older. If gender is not documented or "unknown", there will be no eGFR calculation.   POC BLOOD GLUCOSE (RESULTS)   Result Value Ref Range    GLUCOSE, POC 371 50 - 500 mg/dl   POC BLOOD GLUCOSE (RESULTS)   Result Value Ref Range    GLUCOSE, POC 382 50 - 500 mg/dl   POC BLOOD GLUCOSE (RESULTS)   Result Value Ref Range    GLUCOSE, POC 410 50 - 500 mg/dl   POC BLOOD GLUCOSE (RESULTS)   Result Value Ref Range    GLUCOSE, POC 369 50 - 500 mg/dl   POC BLOOD GLUCOSE (RESULTS)   Result Value Ref Range    GLUCOSE, POC 191 50 - 500 mg/dl   POC BLOOD GLUCOSE (RESULTS)   Result Value Ref Range    GLUCOSE, POC 312 50 - 500 mg/dl       Diagnostic Imaging:  FLUORO LOWER EXTREMITY IN OR LEFT   Final Result   Interval placement of 3 orthopedic fixation screws across the previously noted subcapital fracture of the proximal left femur is seen.            Radiologist location ID: [id number]        XR HIP LEFT W PELVIS 2-3 VIEWS   Final Result   Acute left femoral subcapital neck fracture.                Radiologist location ID: [id number]        XR LUMBAR SPINE AP AND LAT   Final Result   MILD DEGENERATIVE CHANGES OF THE LUMBAR SPINE. NO ACUTE FINDINGS.               Radiologist location ID: [id number]        XR FEMUR LEFT   Final Result   Acute left femoral subcapital neck fracture.  Radiologist location ID: [id number]         XR HAND LEFT 2 VIEW   Final Result   DEGENERATIVE OSTEOARTHROSIS. NO ACUTE FINDINGS.                Radiologist location ID: [id number]              Assessment/Plan:  Patient Active Problem List   Diagnosis    Closed left hip fracture (CMS HCC)    Diabetes mellitus type 2, noninsulin dependent (CMS HCC)    GERD without esophagitis    Food impaction of esophagus    Hypercholesterolemia    Benign essential HTN    Grief reaction    Fatigue        Closed left hi fracture  S/p ORIF 04/14/22  DVT prophylaxis per orthopedics  Patient does  not wish to go to rehab. She says that she will be able to meet all of her ADL's at home and has no concerns regarding decreased mobility or fall.   PT and home health    AKI  Resolved    UTI  Urine culture shows E. Coli, sensitive to Rocephin.   Was given Rocephin 1gm IM x 1 04/13/22  Start Rocephin 1gm IV daily x 3 days.     After today's evaluation, patient's O2 saturation had decreased into the 80's at rest. IV fluids were discontinued and patient given Lasix 40 mg IV x 1. If she improves, may be able to be discharged home this afternoon.     DVT Prophylaxis: Aspirin  Diet: regular  Physical Therapy: Consulted  Disposition: Anticipate discharge home later today with home health.       Nehemiah Massed, PA-C

## 2022-04-16 NOTE — Care Management Notes (Signed)
[facility]  Care Management Initial Evaluation    Patient Name: Brandy Flores  Date of Birth: 1955-12-05  Sex: female  Date/Time of Admission: 04/13/2022  8:05 PM  Room/Bed: 360/A  Payor: AETNA-MEDI-ADV / Plan: AETNA MEDICARE ADVANTAGE PPO / Product Type: PPO /   Primary Care Providers:  Pcp, No (General)    Pharmacy Info:   Preferred Pharmacy       [facility] - [place], [place] - [address]    [address] [place] [address]    Phone: [phone] Fax: [phone]    Hours: Not open 24 hours          Emergency Contact Info:   Extended Emergency Contact Information  Primary Emergency Contact: OLIVER,CHRISTY  Address: [address]           [place], [place] [address] [place]  Home Phone: [phone]  Relation: Daughter  Secondary Emergency Contact: Jonetta Speak  Mobile Phone: [phone]  Relation: Other    History:   [name] is a 66 y.o., female, admitted 04/13/2022    Height/Weight: 162.6 cm (5\' 4" ) / 74.2 kg (163 lb 8 oz)     LOS: 3 days   Admitting Diagnosis: Closed left hip fracture (CMS HCC) [S72.002A]    Assessment:      04/16/22 0828   Assessment Details   Assessment Type Admission   Date of Care Management Update 04/16/22   Readmission   Is this a readmission? No   Insurance Information/Type   Insurance type Medicare   Medicare Intent to Discharge Documentation   Discharge IMM Letter Given Date 04/16/22   Discharge IMM Letter Given Time 0730   Discharge Patient Representative: Patient/Self   IMM explained/reviewed with:  Patient;verbalized understanding   Employment/Financial   Patient has Prescription Coverage?  Yes        Name of Insurance Coverage for Medications Medicare Part D   Financial Concerns none   Living Environment   Lives With alone   Living Arrangements house   Able to Return to Prior Arrangements yes   Living Arrangement Comments Has assistance in home by Significant Other and family living next door   Home Safety   Home  Assessment: No Problems Identified   Home Accessibility no concerns;stairs to enter home   Home Safety Comments Patient denied safety concerns at home   Custody and Legal Status   Do you have a court appointed guardian/conservator? No   Are you an emancipated minor? No   Custody Issues? No   Paternity Affidavit Requested? No   Care Management Plan   Discharge Planning Status initial meeting   Projected Discharge Date 04/16/22   Discharge plan discussed with: Patient   CM will evaluate for rehabilitation potential yes   Discharge Needs Assessment   Equipment Currently Used at Home walker, rolling;wheelchair;shower chair   Equipment Needed After Discharge none   Discharge Facility/Level of Care Needs Home (Patient/Family Member/other)(code 1)   Transportation Available car;family or friend will provide   Referral Information   Admission Type inpatient   Arrived From home or self-care         Discharge Plan:  Home (Patient/Family Member/other) (code 1)    On contact, patient is alert and oriented X 3. Patient lives alone at physical address [address]/[place]. Patient reports having SO, Timmy Green and family living next door.  Patient reports having 6 stairs with railings to enter/exit home. Bed/Bath on same level.  Patient denies safety concerns in home. Patient denies Formal Supports, denies use of home Oxygen Therapy and denies use of assistive device(s) to ambulate. Patient reports ability to manage her self care independent of assistance. Patient reports having deceased husband's DME of [employer], Biomedical [employer], [employer].  No other DME needed per patient. Patient obtains her Rx meds at [facility] and her primary healthcare with Dr. Cammie Sickle. Patient transports self and denies barriers to healthcare and pharmacy access.   Patient offered [facility] services for PT, SN when discharged to which patient denies need.    Patient ambulated 350 feet with PT.  No problems/concerns  identified with mobility.   No DC needs identified.     DC Plan to home with family support.       The patient will continue to be evaluated for developing discharge needs.     Case Manager: Clarisa Schools, MSW  Phone: [phone]

## 2022-04-16 NOTE — OT Treatment (Signed)
[facility]  [address]  [place], [address]  [phone]  (Fax) [phone]  Rehabilitation Services  Occupational Therapy     Patient Name: Brandy Flores  Date of Birth: 09/09/1955  Height: Height: 162.6 cm (5\' 4" )  Weight: Weight: 74.2 kg (163 lb 8 oz)  Room/Bed: 360/A  Payor: AETNA-MEDI-ADV / Plan: AETNA MEDICARE ADVANTAGE PPO / Product Type: PPO /     PATIENT DID NOT PARTICIPATE IN THERAPY TODAY DUE TO: INAPPROPRIATE AT THIS TIME Patient throwing up and not feeling well COTA and PTA notified nursing.         , COTA 04/16/2022,10:58

## 2022-04-17 DIAGNOSIS — N390 Urinary tract infection, site not specified: Secondary | ICD-10-CM | POA: Insufficient documentation

## 2022-04-17 LAB — POC BLOOD GLUCOSE (RESULTS)
GLUCOSE, POC: 188 mg/dl (ref 50–500)
GLUCOSE, POC: 277 mg/dl (ref 50–500)

## 2022-04-17 LAB — BASIC METABOLIC PANEL
ANION GAP: 6 mmol/L (ref 4–13)
BUN/CREA RATIO: 21 (ref 6–22)
BUN: 24 mg/dL (ref 7–25)
CALCIUM: 8.7 mg/dL (ref 8.6–10.3)
CHLORIDE: 105 mmol/L (ref 98–107)
CO2 TOTAL: 27 mmol/L (ref 21–31)
CREATININE: 1.17 mg/dL (ref 0.60–1.30)
ESTIMATED GFR: 51 mL/min/{1.73_m2} — ABNORMAL LOW (ref >59)
GLUCOSE: 155 mg/dL — ABNORMAL HIGH (ref 74–109)
OSMOLALITY, CALCULATED: 283 mOsm/kg (ref 270–290)
POTASSIUM: 4.9 mmol/L (ref 3.5–5.1)
SODIUM: 138 mmol/L (ref 136–145)

## 2022-04-17 LAB — CBC
HCT: 38.1 % (ref 31.2–41.9)
HGB: 12.9 g/dL (ref 10.9–14.3)
MCH: 30.4 pg (ref 24.7–32.8)
MCHC: 33.8 g/dL (ref 32.3–35.6)
MCV: 89.9 fL (ref 75.5–95.3)
MPV: 7.8 fL — ABNORMAL LOW (ref 7.9–10.8)
PLATELETS: 176 10*3/uL (ref 140–440)
RBC: 4.23 10*6/uL (ref 3.63–4.92)
RDW: 13.7 % (ref 12.3–17.7)
WBC: 8.3 10*3/uL (ref 3.8–11.8)

## 2022-04-17 LAB — MAGNESIUM: MAGNESIUM: 1.9 mg/dL (ref 1.9–2.7)

## 2022-04-17 MED ORDER — CEFDINIR 300 MG CAPSULE
300.0000 mg | ORAL_CAPSULE | Freq: Two times a day (BID) | ORAL | 0 refills | Status: AC
Start: 2022-04-17 — End: 2022-04-22

## 2022-04-17 NOTE — PT Treatment (Signed)
[facility]  [place], [address]  [phone]  [phone]  Rehabilitation Department  Physical Therapy Daily Inpatient Note    Date: 04/17/2022  Patient's Name: Brandy Flores  Date of Birth: Nov 14, 1955  Height: Height: 162.6 cm (5\' 4" )  Weight: Weight: 74.2 kg (163 lb 8 oz)      Plan: Will continue under current POC.         Subjective/Objective/Assessment:  Flowsheet    04/17/22 0900   Rehab Session   Document Type therapy progress note (daily note)   Patient Effort good   General Information   Patient Profile Reviewed yes   Medical Lines PIV Line   Respiratory Status nasal cannula;room air   Existing Precautions/Restrictions fall precautions   Pre Treatment Status   Pre Treatment Patient Status Patient supine in bed   Support Present Pre Treatment  Family present   Communication Pre Treatment  Charge Nurse   Communication Pre Treatment Comment cleared for PT   Pre- Treatment Vital Signs   Pre-Treatment Heart Rate (beats/min) 90   Pre SpO2 (%) 96   O2 Delivery Pre Treatment room air   Pre-Treatment Pain   Pretreatment Pain Rating 2/10   Pre/Posttreatment Pain Comment left hip   Bed Mobility Assessment/Treatment   Supine-Sit Independence modified independence   Sit to Supine, Independence modified independence   Transfer Assessment/Treatment   Sit-Stand Independence stand-by assistance   Stand-Sit Independence stand-by assistance   Sit-Stand-Sit, Assist Device walker, front wheeled   Gait Assessment/Treatment   Total Distance Ambulated 340   Independence  stand-by assistance   Assistive Device  walker, front wheeled   Balance Skill Training   Sitting Balance: Static good balance   Sitting, Dynamic (Balance) good balance   Sit-to-Stand Balance good balance   Standing Balance: Static good balance   Post Treatment Status   Post Treatment Patient Status Patient sitting in bedside chair or w/c   Support Present Post Treatment  Family present   Communication  Post Treatement Nurse   Post-Treatment Vital Signs   Post-treatment Heart Rate (beats/min) 105   Post SpO2 (%) 90   O2 Delivery Post Treatment room air   Post-Treatment Pain   Posttreatment Pain Rating 2/10   Cognitive Assessment/Intervention   Behavior/Mood Observations behavior appropriate to situation, WNL/WFL   Physical Therapy Time and Intention   Total PT Minutes: 10     Treatment time:  0822---- modified independent in and oob, gaited with rw 365ft, good step thru gait, patient stated she has been doing her exs... oxygen did well today, was 90 after walking without oxygen, nsg aware as well            Intervention minutes: GAIT TRAINING 10 MINUTES    THERAPIST  Julio Sicks, PTA  04/17/2022, 09:10

## 2022-04-17 NOTE — Discharge Summary (Signed)
[place]     DISCHARGE SUMMARY      PATIENT NAME:  Brandy Flores   MRN:  [id number]  DOB:  12/11/1955    INPATIENT ADMISSION DATE: 04/13/2022   DATE OF DISCHARGE:  04/17/22     ATTENDING PHYSICIAN: Nira Conn Redden, FNP-BC     PRIMARY CARE PHYSICIAN: No Pcp     HOSPITAL PRESENTATION:  Fall with injury    Please see full admission H&P for details.      As per [id number] Steuber is a 66 y.o. female who was was admitted to the hospitalist service on 04/13/22 following a fall that resulted in a left femoral neck fracture. At the time of her admission, she denied any dizziness or syncopal episode(s) and stated that she had fallen as a result of losing her balance while moving chairs.  She underwent ORIF of the left femur on 04/14/22. Following surgery, she has been working with PT, walking an average of 340 feet. She did have evidence of a mild AKI with serum Cr increase to 1.40, which had resolved with IV fluids.  She was also found to have a UTI on 9/12. Culture report showed resistance to fluoroquinolones and bactrim, but did show sensitivity to cephalosporins and Macrobid and was started on Rocephin on 9/15. She was going to be discharged on 04/16/22, however her oxygen level desaturated to the mid 80's while getting up to work with PT. She denied any chest pain, shortness of breath, edema. She denies any recent history of smoking. A chest x-ray was obtained that showed low lung volume but did not show any other acute findings. Her IV fluids were discontinued and she was given two doses of Lasix 40 mg IV. During this morning's evaluation, she stated that she felt well and would like to go home. Per PT, she walked about 340 feet and her oxygen saturation on room air did not drop below 90%. Patient has elected to go home with home health instead of inpatient rehab. She says that she has help at home, will be able to meet all of her ADL's, and is not concerned about decreased mobility or risk  of falling. She will need to follow up with her PCP in 1-2 weeks, will need to follow up with orthopedic surgeon in 2 weeks or as otherwise recommended. Plan of care was discussed with patient and all questions were answered.       Problem List:  Active Hospital Problems   (*Primary Problem)    Diagnosis    *Closed left hip fracture (CMS HCC)    UTI (urinary tract infection)               PHYSICAL EXAM   DAY OF DISCHARGE:    BP 110/79   Pulse 81   Temp 36.1 C (97 F)   Resp 17   Ht 1.626 m (5\' 4" )   Wt 74.2 kg (163 lb 8 oz)   SpO2 93%   BMI 28.06 kg/m          General:  Patient is resting in bed, no acute distress, alert and oriented x3   Eyes:  PERRL, no scleral icterus   HENT:  Normocephalic, atraumatic.  Heart:  RRR, S1 and S2 auscultated, no murmurs appreciated   Lungs:  Unlabored respirations.  Lungs are clear to auscultation bilaterally, no wheezes, no rales  Abdomen:  Soft, active bowel sounds, non-tender to palpation, non-distended  Extremities:  Dressing to left hip intact.  No pedal edema.    Skin:  Warm and dry.  Not diaphoretic  Neuro:  A&O x 3.  No focal deficits.  Speech intact.  Not tremulous  Psych:  Cooperative, not agitated    LABS:  CBC with Diff (Last 48 Hours):    Recent Results in last 48 hours     04/16/22  0425 04/17/22  0505   WBC 9.5 8.3   HGB 12.2 12.9   HCT 35.7 38.1   MCV 90.1 89.9   PLTCNT 166 176          Last BMP  (Last result in the past 48 hours)        Na   K   Cl   CO2   BUN   Cr   Calcium   Glucose   Glucose-Fasting        04/17/22 0505 138   4.9   105   27   24   1.17   8.7   155                  BMP (Last 48 Hours):    Recent Results in last 48 hours     04/15/22  1404 04/16/22  0425 04/17/22  0505   SODIUM  --  138 138   POTASSIUM 4.7 4.4 4.9   CHLORIDE  --  107 105   CO2  --  25 27   BUN  --  23 24   CREATININE  --  1.20 1.17   CALCIUM  --  8.8 8.7   GLUCOSENF  --  207* 155*          DISCHARGE MEDICATIONS:     Current Discharge Medication List        START taking  these medications.        Details   aspirin 81 mg Tablet, Chewable   81 mg, Oral, 2 TIMES DAILY  Qty: 28 Tablet  Refills: 0     cefdinir 300 mg Capsule  Commonly known as: OMNICEF   300 mg, Oral, 2 TIMES DAILY  Qty: 10 Capsule  Refills: 0     cholecalciferol (vitamin D3) 25 mcg (1,000 unit) Tablet   1,000 Units, Oral, DAILY  Qty: 30 Tablet  Refills: 0            CONTINUE these medications which have CHANGED during your visit.        Details   HYDROcodone-acetaminophen 10-325 mg Tablet  Commonly known as: Hand  What changed: Another medication with the same name was removed. Continue taking this medication, and follow the directions you see here.   1 Tablet, Oral, EVERY 6 HOURS PRN  Refills: 0            CONTINUE these medications - NO CHANGES were made during your visit.        Details   chlorTHALIDONE 25 mg Tablet  Commonly known as: HYGROTON   25 mg, Oral, DAILY  Refills: 0     glipiZIDE 5 mg Tablet  Commonly known as: GLUCOTROL   10 mg, Oral, EVERY MORNING BEFORE BREAKFAST, Take 30 minutes before meals  Refills: 0     Jardiance 10 mg Tablet  Generic drug: empagliflozin   10 mg, Oral, DAILY  Refills: 0     lisinopriL 2.5 mg Tablet  Commonly known as: PRINIVIL   2.5 mg, Oral, DAILY  Refills: 0     MetFORMIN 1,000 mg Tablet  Commonly known as:  GLUCOPHAGE   1,000 mg, Oral, 2 TIMES DAILY WITH FOOD  Refills: 0     nitroGLYCERIN 0.4 mg Tablet, Sublingual  Commonly known as: NITROSTAT   0.4 mg, Sublingual, EVERY 5 MIN PRN, for 3 doses over 15 minutes  Refills: 0     pantoprazole 40 mg Tablet, Delayed Release (E.C.)  Commonly known as: PROTONIX   40 mg, Oral, DAILY  Refills: 0     simvastatin 20 mg Tablet  Commonly known as: ZOCOR   20 mg, Oral, EVERY EVENING  Refills: 0            STOP taking these medications.      Ativan 0.5 mg Tablet  Generic drug: LORazepam     Prevacid 30 mg Capsule, Delayed Release(E.C.)  Generic drug: lansoprazole                 DISCHARGE DISPOSITION:  Home with home health    DISCHARGE  INSTRUCTIONS:   ADA diet   Follow-up Information       Marcha Dutton, DO Follow up on 04/28/2022.    Specialty: ORTHOPAEDIC SURGERY  Why: September 27th @9 :15am with Cordie Grice NP    bring photo id, bring insurance card, bring list of medications  Contact information:  [address]  [place] [address]  [phone]               Pcp, No Follow up.                              PT EVALUATE AND TREAT- OUTPATIENT     Need to be addressed: EVALUATE AND TREAT    Reason: post op      DME - [facility]    Please note - If patient is 300 lbs or greater please order bariatric or heavy duty items.     Patient has mobility limits that significantly impairs ability to participate in one or more mobility related ADL's (MRADL's): Yes    Moblity Limitations: Pt at heightened risk of injury r/t attempts to fulfill MRADL's & can safely use walker which resolves issue    Walker Type: Front Wheeled    Freedom of Choice: I have informed patient of their freedom of choice with respect to DME providers      DME - BEDSIDE COMMODE    A standard commode is covered when the patient is physically incapable of utilizing regular toilet facilities.  An extra wide/heavy duty commode chair is covered for a patient who weighs 300 pounds or more.     I certify that a bedside commode is necessary due to Patient is confined to a single room    Bedside Commode Type Standard Bedside Commode    Freedom of Choice: I have informed patient of their freedom of choice with respect to DME providers      DME - [facility]    Please note - If patient is 300 lbs or greater please order bariatric or heavy duty items.     Patient has mobility limits that significantly impairs ability to participate in one or more mobility related ADL's (MRADL's): Yes    Moblity Limitations: Pt at heightened risk of injury r/t attempts to fulfill MRADL's & can safely use walker which resolves issue    Walker Type: Front Wheeled    Freedom of Choice: I  have informed patient of their freedom of choice with respect to DME  providers      DME - BEDSIDE COMMODE    A standard commode is covered when the patient is physically incapable of utilizing regular toilet facilities.  An extra wide/heavy duty commode chair is covered for a patient who weighs 300 pounds or more.     I certify that a bedside commode is necessary due to Patient is confined to a single room    Bedside Commode Type Standard Bedside Commode    Freedom of Choice: I have informed patient of their freedom of choice with respect to DME providers                 Copies sent to Care Team         Relationship Specialty Notifications Start End    Pcp, No PCP - General   03/17/22               Patient has been seen and examined daily by the attending hospitalist. The chart and plan of care have been reviewed and discussed in detail.      Charmaine Downs, PA-C  [facility]

## 2022-04-20 ENCOUNTER — Telehealth (HOSPITAL_COMMUNITY): Payer: Self-pay

## 2023-01-19 ENCOUNTER — Emergency Department (HOSPITAL_COMMUNITY): Payer: Medicare PPO

## 2023-01-19 ENCOUNTER — Other Ambulatory Visit: Payer: Self-pay

## 2023-01-19 ENCOUNTER — Emergency Department
Admission: EM | Admit: 2023-01-19 | Discharge: 2023-01-19 | Disposition: A | Discharge status: Home / Self Care | Payer: Medicare PPO | Attending: Emergency Medicine | Admitting: Emergency Medicine

## 2023-01-19 DIAGNOSIS — M25552 Pain in left hip: Secondary | ICD-10-CM | POA: Insufficient documentation

## 2023-01-19 DIAGNOSIS — R269 Unspecified abnormalities of gait and mobility: Secondary | ICD-10-CM | POA: Insufficient documentation

## 2023-01-19 DIAGNOSIS — N390 Urinary tract infection, site not specified: Secondary | ICD-10-CM | POA: Insufficient documentation

## 2023-01-19 LAB — URINALYSIS, MICROSCOPIC
BACTERIA: NEGATIVE /hpf
RBCS: 2 /hpf (ref <4)
SQUAMOUS EPITHELIAL: 1 /hpf (ref <28)
WBCS: 73 /hpf — ABNORMAL HIGH (ref <6)

## 2023-01-19 LAB — URINALYSIS, MACROSCOPIC
BILIRUBIN: NEGATIVE mg/dL
BLOOD: NEGATIVE mg/dL
GLUCOSE: >1000 mg/dL — AB
KETONES: NEGATIVE mg/dL
LEUKOCYTES: 250 WBCs/uL — AB
PH: 5 (ref 5.0–9.0)
PROTEIN: NEGATIVE mg/dL
SPECIFIC GRAVITY: 1.03 (ref 1.002–1.030)
UROBILINOGEN: NORMAL mg/dL

## 2023-01-19 LAB — CBC WITH DIFF
BASOPHIL #: 0 10*3/uL (ref 0.00–0.10)
BASOPHIL %: 1 % (ref 0–1)
EOSINOPHIL #: 0.2 10*3/uL (ref 0.00–0.50)
EOSINOPHIL %: 3 %
HCT: 40.7 % (ref 31.2–41.9)
HGB: 13.9 g/dL (ref 10.9–14.3)
LYMPHOCYTE #: 2.7 10*3/uL (ref 1.00–3.00)
LYMPHOCYTE %: 40 % (ref 16–44)
MCH: 30.4 pg (ref 24.7–32.8)
MCHC: 34.1 g/dL (ref 32.3–35.6)
MCV: 89.2 fL (ref 75.5–95.3)
MONOCYTE #: 0.4 10*3/uL (ref 0.30–1.00)
MONOCYTE %: 7 % (ref 5–13)
MPV: 7.7 fL — ABNORMAL LOW (ref 7.9–10.8)
NEUTROPHIL #: 3.5 10*3/uL (ref 1.85–7.80)
NEUTROPHIL %: 51 % (ref 43–77)
PLATELETS: 218 10*3/uL (ref 140–440)
RBC: 4.56 10*6/uL (ref 3.63–4.92)
RDW: 14 % (ref 12.3–17.7)
WBC: 6.9 10*3/uL (ref 3.8–11.8)

## 2023-01-19 LAB — COMPREHENSIVE METABOLIC PANEL, NON-FASTING
ALBUMIN/GLOBULIN RATIO: 1.6 — ABNORMAL HIGH (ref 0.8–1.4)
ALBUMIN: 4.5 g/dL (ref 3.5–5.7)
ALKALINE PHOSPHATASE: 89 U/L (ref 34–104)
ALT (SGPT): 19 U/L (ref 7–52)
ANION GAP: 10 mmol/L (ref 4–13)
AST (SGOT): 19 U/L (ref 13–39)
BILIRUBIN TOTAL: 0.6 mg/dL (ref 0.3–1.2)
BUN/CREA RATIO: 17 (ref 6–22)
BUN: 18 mg/dL (ref 7–25)
CALCIUM, CORRECTED: 9.8 mg/dL (ref 8.9–10.8)
CALCIUM: 10.2 mg/dL (ref 8.6–10.3)
CHLORIDE: 105 mmol/L (ref 98–107)
CO2 TOTAL: 22 mmol/L (ref 21–31)
CREATININE: 1.09 mg/dL (ref 0.60–1.30)
ESTIMATED GFR: 56 mL/min/{1.73_m2} — ABNORMAL LOW (ref >59)
GLOBULIN: 2.9 (ref 2.9–5.4)
GLUCOSE: 249 mg/dL — ABNORMAL HIGH (ref 74–109)
OSMOLALITY, CALCULATED: 284 mOsm/kg (ref 270–290)
POTASSIUM: 4.9 mmol/L (ref 3.5–5.1)
PROTEIN TOTAL: 7.4 g/dL (ref 6.4–8.9)
SODIUM: 137 mmol/L (ref 136–145)

## 2023-01-19 LAB — LACTIC ACID LEVEL W/ REFLEX FOR LEVEL >2.0: LACTIC ACID: 2.5 mmol/L — ABNORMAL HIGH (ref 0.5–2.2)

## 2023-01-19 MED ORDER — CEPHALEXIN 500 MG CAPSULE
500.0000 mg | ORAL_CAPSULE | Freq: Three times a day (TID) | ORAL | 0 refills | Status: AC
Start: 2023-01-19 — End: 2023-01-26

## 2023-01-19 MED ORDER — KETOROLAC 10 MG TABLET
10.0000 mg | ORAL_TABLET | Freq: Four times a day (QID) | ORAL | 0 refills | Status: AC | PRN
Start: 2023-01-19 — End: 2023-01-24

## 2023-01-19 MED ORDER — KETOROLAC 30 MG/ML (1 ML) INJECTION SOLUTION
30.0000 mg | INTRAMUSCULAR | Status: AC
Start: 2023-01-19 — End: 2023-01-19
  Administered 2023-01-19: 30 mg via INTRAMUSCULAR

## 2023-01-19 MED ORDER — KETOROLAC 30 MG/ML (1 ML) INJECTION SOLUTION
INTRAMUSCULAR | Status: AC
Start: 2023-01-19 — End: 2023-01-19
  Filled 2023-01-19: qty 1

## 2023-01-19 NOTE — ED Provider Notes (Signed)
[facility]  ED Physician Note      Chief Complaint:    Chief Complaint   Patient presents with    Flank Pain    Hip Pain     Brandy Flores is a 67 y.o. female who had concerns including Flank Pain and Hip Pain.    History of Present Illness:    Flank Pain    Hip Pain        67 year old female complains of left hip pain and left flank pain.  Patient states this has been chronic for at least a year.  No known acute injury.  There is radiation of the pain down the back of the left leg into the left buttocks.  Described as a burning sensation.  No new falls.  Does have some dysuria and thinks she may have a urinary tract infection as well.    Past Medical History:   Diagnosis Date    Arthropathy     Diabetes mellitus, type 2 (CMS HCC)     Esophageal reflux     HTN (hypertension)     Hypercholesterolemia     Perforation of tympanic membrane     Renal cyst        Review Of Systems: See HPI    Physical Exam:    ED Triage Vitals [01/19/23 2125]   BP (Non-Invasive) 127/83   Heart Rate 88   Respiratory Rate 18   Temperature 36.6 C (97.9 F)   SpO2 100 %   Weight 69.9 kg (154 lb)   Height 1.626 m (5\' 4" )     Physical Exam  Vitals and nursing note reviewed.   Constitutional:       General: She is not in acute distress.     Appearance: Normal appearance.   HENT:      Head: Normocephalic and atraumatic.      Nose: Nose normal.      Mouth/Throat:      Mouth: Mucous membranes are moist.   Eyes:      General: No scleral icterus.  Cardiovascular:      Rate and Rhythm: Normal rate and regular rhythm.      Chest Wall: PMI is not displaced.      Pulses: Normal pulses.      Heart sounds: Normal heart sounds, S1 normal and S2 normal.      No S3 or S4 sounds.   Pulmonary:      Effort: Pulmonary effort is normal. No respiratory distress.      Breath sounds: Normal breath sounds.   Abdominal:      General: Abdomen is flat. There is no distension.      Palpations: Abdomen is soft.      Tenderness: There is no  abdominal tenderness. There is no right CVA tenderness or left CVA tenderness.   Musculoskeletal:      Cervical back: Neck supple. No tenderness.   Skin:     General: Skin is warm and dry.      Capillary Refill: Capillary refill takes less than 2 seconds.   Neurological:      General: No focal deficit present.      Mental Status: She is alert and oriented to person, place, and time.      Gait: Gait abnormal (Patient does have a slight side bend in her gait.  This is chronic and has been persistent like this for a year.).   Psychiatric:  Behavior: Behavior normal.          ED Course/MDM:    Results for orders placed or performed during the hospital encounter of 01/19/23 (from the past 12 hour(s))   COMPREHENSIVE METABOLIC PANEL, NON-FASTING   Result Value Ref Range    SODIUM 137 136 - 145 mmol/L    POTASSIUM 4.9 3.5 - 5.1 mmol/L    CHLORIDE 105 98 - 107 mmol/L    CO2 TOTAL 22 21 - 31 mmol/L    ANION GAP 10 4 - 13 mmol/L    BUN 18 7 - 25 mg/dL    CREATININE 7.82 9.56 - 1.30 mg/dL    BUN/CREA RATIO 17 6 - 22    ESTIMATED GFR 56 (L) >59 mL/min/1.9m^2    ALBUMIN 4.5 3.5 - 5.7 g/dL    CALCIUM 21.3 8.6 - 08.6 mg/dL    GLUCOSE 578 (H) 74 - 109 mg/dL    ALKALINE PHOSPHATASE 89 34 - 104 U/L    ALT (SGPT) 19 7 - 52 U/L    AST (SGOT) 19 13 - 39 U/L    BILIRUBIN TOTAL 0.6 0.3 - 1.2 mg/dL    PROTEIN TOTAL 7.4 6.4 - 8.9 g/dL    ALBUMIN/GLOBULIN RATIO 1.6 (H) 0.8 - 1.4    OSMOLALITY, CALCULATED 284 270 - 290 mOsm/kg    CALCIUM, CORRECTED 9.8 8.9 - 10.8 mg/dL    GLOBULIN 2.9 2.9 - 5.4   LACTIC ACID LEVEL W/ REFLEX FOR LEVEL >2.0   Result Value Ref Range    LACTIC ACID 2.5 (H) 0.5 - 2.2 mmol/L   CBC WITH DIFF   Result Value Ref Range    WBC 6.9 3.8 - 11.8 x10^3/uL    RBC 4.56 3.63 - 4.92 x10^6/uL    HGB 13.9 10.9 - 14.3 g/dL    HCT 46.9 62.9 - 52.8 %    MCV 89.2 75.5 - 95.3 fL    MCH 30.4 24.7 - 32.8 pg    MCHC 34.1 32.3 - 35.6 g/dL    RDW 41.3 24.4 - 01.0 %    PLATELETS 218 140 - 440 x10^3/uL    MPV 7.7 (L) 7.9 - 10.8 fL     NEUTROPHIL % 51 43 - 77 %    LYMPHOCYTE % 40 16 - 44 %    MONOCYTE % 7 5 - 13 %    EOSINOPHIL % 3 %    BASOPHIL % 1 0 - 1 %    NEUTROPHIL # 3.50 1.85 - 7.80 x10^3/uL    LYMPHOCYTE # 2.70 1.00 - 3.00 x10^3/uL    MONOCYTE # 0.40 0.30 - 1.00 x10^3/uL    EOSINOPHIL # 0.20 0.00 - 0.50 x10^3/uL    BASOPHIL # 0.00 0.00 - 0.10 x10^3/uL   URINALYSIS, MACROSCOPIC   Result Value Ref Range    COLOR Light Yellow Colorless, Light Yellow, Yellow    APPEARANCE Clear Clear    SPECIFIC GRAVITY 1.030 1.002 - 1.030    PH 5.0 5.0 - 9.0    LEUKOCYTES 250 (A) Negative, 100  WBCs/uL    NITRITE 2+ (A) Negative    PROTEIN Negative Negative, 10 , 20  mg/dL    GLUCOSE >2725 (A) Negative, 30  mg/dL    KETONES Negative Negative, Trace mg/dL    BILIRUBIN Negative Negative, 0.5 mg/dL    BLOOD Negative Negative, 0.03 mg/dL    UROBILINOGEN Normal Normal mg/dL   URINALYSIS, MICROSCOPIC   Result Value Ref Range    BACTERIA Negative Negative /  hpf    RBCS 2 <4 /hpf    WBCS 73 (H) <6 /hpf    WHITE BLOOD CELL CLUMP Few (A) (none) /hpf    SQUAMOUS EPITHELIAL 1 <28 /hpf     XR HIP LEFT   Final Result by Edi, Radresults In (06/19 2222)   Satisfactory postop alignment                Radiologist location ID: [id number]           Medications Administered in the ED   ketorolac (TORADOL) 30 mg/mL injection (30 mg IntraMUSCULAR Given 01/19/23 2158)     ED Course as of 01/19/23 2304   Wed Jan 19, 2023   2302 XR HIP LEFT  No hardware malfunction.  No other fracture.   2302 URINALYSIS, MACROSCOPIC AND MICROSCOPIC W/CULTURE REFLEX(!)  Slight suggestion of urinary tract infection.  Will start on Keflex 500 mg t.i.d. for 7 days.   2302 NITRITE(!): 2+   2302 WBC: 6.9   2302 CREATININE: 1.09   2302 LACTIC ACID(!): 2.5   2302 Patient was re-evaluated after given a dose Toradol for her left hip pain.  She states that the Toradol did help.  Will go ahead and prescribe this for her orally.            Medical Decision Making  Amount and/or Complexity of Data Reviewed  Labs:  ordered. Decision-making details documented in ED Course.  Radiology: ordered. Decision-making details documented in ED Course.    Risk  OTC drugs.  Prescription drug management.        - I have considered the patient's social determinants of health such as access to care, financial implications, housing security, health care literacy, and transportation while formulating a plan of care for the patient.     Impression:    Clinical Impression   Left hip pain (Primary)   UTI (urinary tract infection)         Disposition:   Discharged        Part of this note may have been generated using voice recognition software.  Be advised, it is possible that the generated note may be prone to syntax and other dictation software errors.

## 2023-01-19 NOTE — ED Nurses Note (Signed)
Discharged home, received written and verbal discharge instructions by provider. Made aware to follow up with PCP, return for worsening symptoms. Ambulating from triage area without difficulty.

## 2023-01-19 NOTE — ED Triage Notes (Signed)
Left flank pain x one month with left hip pain since last fall, "I think I got a urinary infection"

## 2023-01-20 LAB — BLUE TOP TUBE

## 2023-01-23 LAB — URINE CULTURE,ROUTINE: URINE CULTURE: >100000 — AB

## 2023-01-24 ENCOUNTER — Other Ambulatory Visit (HOSPITAL_COMMUNITY): Payer: Self-pay | Admitting: Physician Assistant

## 2023-01-24 MED ORDER — LINEZOLID 600 MG TABLET
600.0000 mg | ORAL_TABLET | Freq: Two times a day (BID) | ORAL | 0 refills | Status: AC
Start: 2023-01-24 — End: 2023-01-29

## 2023-03-02 ENCOUNTER — Emergency Department
Admission: EM | Admit: 2023-03-02 | Discharge: 2023-03-02 | Disposition: A | Discharge status: Home / Self Care | Payer: Medicare PPO | Attending: Family | Admitting: Family

## 2023-03-02 ENCOUNTER — Encounter (HOSPITAL_COMMUNITY): Payer: Self-pay | Admitting: Family

## 2023-03-02 ENCOUNTER — Other Ambulatory Visit: Payer: Self-pay

## 2023-03-02 DIAGNOSIS — H6692 Otitis media, unspecified, left ear: Secondary | ICD-10-CM | POA: Insufficient documentation

## 2023-03-02 MED ORDER — KETOROLAC 30 MG/ML (1 ML) INJECTION SOLUTION
INTRAMUSCULAR | Status: AC
Start: 2023-03-02 — End: 2023-03-02
  Filled 2023-03-02: qty 1

## 2023-03-02 MED ORDER — KETOROLAC 30 MG/ML (1 ML) INJECTION SOLUTION
15.0000 mg | INTRAMUSCULAR | Status: AC
Start: 2023-03-02 — End: 2023-03-02
  Administered 2023-03-02: 15 mg via INTRAMUSCULAR

## 2023-03-02 MED ORDER — AZITHROMYCIN 250 MG TABLET
ORAL_TABLET | ORAL | 0 refills | Status: DC
Start: 2023-03-02 — End: 2023-04-24

## 2023-03-02 NOTE — ED Provider Notes (Signed)
[facility]  ED Primary Provider Note  History of Present Illness   Chief Complaint   Patient presents with    Ear Pain     Brandy Flores is a 67 y.o. female who had concerns including Ear Pain.  Arrival: The patient arrived by Car    Patient presents emergency room for evaluation of left ear pain x1 day.  Patient states she did take Tylenol and her home pain medication without resolution of the pain.  Patient states she has had some drainage from the left ear.  She states she was fitted for a hearing aid and has had some trouble with the ear since.  She denies fever, nausea, vomiting.  She has no other associated signs and symptoms at this time.  Pain is currently a 10/10      History Reviewed This Encounter: Medical History  Surgical History  Family History  Social History    Physical Exam   ED Triage Vitals [03/02/23 1657]   BP (Non-Invasive) 119/87   Heart Rate 88   Respiratory Rate 18   Temperature 36.5 C (97.7 F)   SpO2 98 %   Weight 69.9 kg (154 lb)   Height 1.626 m (5\' 4" )     Physical Exam  Constitutional:       Appearance: Normal appearance.   HENT:      Head: Normocephalic and atraumatic.      Nose: Nose normal.      Mouth/Throat:      Mouth: Mucous membranes are moist.      Pharynx: Oropharynx is clear.   Eyes:      Extraocular Movements: Extraocular movements intact.      Conjunctiva/sclera: Conjunctivae normal.      Pupils: Pupils are equal, round, and reactive to light.   Cardiovascular:      Rate and Rhythm: Normal rate and regular rhythm.      Pulses: Normal pulses.      Heart sounds: Normal heart sounds.   Pulmonary:      Effort: Pulmonary effort is normal.      Breath sounds: Normal breath sounds.   Abdominal:      General: Bowel sounds are normal.      Palpations: Abdomen is soft.   Musculoskeletal:         General: Normal range of motion.      Cervical back: Normal range of motion and neck supple.   Skin:     General: Skin is warm and dry.   Neurological:       General: No focal deficit present.      Mental Status: She is alert and oriented to person, place, and time. Mental status is at baseline.   Psychiatric:         Mood and Affect: Mood normal.         Behavior: Behavior normal.         Thought Content: Thought content normal.         Judgment: Judgment normal.       Patient Data   Labs Ordered/Reviewed - No data to display  No orders to display     Medical Decision Making        Medical Decision Making  Will discharge patient home due to no significant/urgent findings requiring admission.  Vital signs are stable the patient is afebrile.  Will place the patient on antibiotics.  She does have allergies to several antibiotics, will place on azithromycin. Pt is  on hydrocodone which she is to continue.    Risk  Prescription drug management.    Critical Care  Total time providing critical care: 0 minutes                Medications Administered in the ED   ketorolac (TORADOL) 30 mg/mL injection (15 mg IntraMUSCULAR Given 03/02/23 1749)     Clinical Impression   Left otitis media (Primary)       Disposition: Discharged    Current Discharge Medication List        START taking these medications    Details   azithromycin (ZITHROMAX) 250 mg Oral Tablet Take 500 mg (2 tab) on day 1; take 250 mg (1 tab) on days 2-5.  Qty: 6 Tablet, Refills: 0           azithromycin

## 2023-03-02 NOTE — ED Triage Notes (Signed)
states pop in the left ear yesterday morning with pain. Denies fever/chill but reports a headache.

## 2023-03-02 NOTE — ED Nurses Note (Signed)
Patient discharged to home at this time. Discharge instructions provided and reviewed, verbalized understanding.

## 2023-03-02 NOTE — Discharge Instructions (Signed)
FOLLOW UP WITH THE PRIMARY CARE PROVIDER AS SOON AS POSSIBLE BUT NO LATER THAN 3 DAYS NOW.  IF NO PRIMARY CARE PROVIDER EXISTS, THEN THE PATIENT IS INSTRUCTED TO ESTABLISH CARE WITH A PRIMARY CARE PROVIDER. FOLLOW-UP WITH ANY SPECIALIST PROVIDER AS INDICATED AS SOON AS POSSIBLE BUT NO LATER THAN 3 DAYS.  NOTIFY THE PRIMARY CARE PROVIDER THAT YOU WERE IN THE EMERGENCY DEPARTMENT WITHIN 24 HOURS OF DISCHARGE TO FOLLOW-UP ON YOUR RESULTS AND/OR TREATMENTS.  RETURN TO THE EMERGENCY DEPARTMENT IMMEDIATELY IF NEEDED, NO BETTER, WORSE, NEW SYMPTOMS ARISE, OR YOU CANNOT FOLLOW-UP WITH YOUR PRIMARY CARE PROVIDER IN THE PRESCRIBED TIMEFRAME.

## 2023-04-24 ENCOUNTER — Encounter (HOSPITAL_BASED_OUTPATIENT_CLINIC_OR_DEPARTMENT_OTHER): Payer: Self-pay

## 2023-04-24 ENCOUNTER — Emergency Department
Admission: EM | Admit: 2023-04-24 | Discharge: 2023-04-24 | Disposition: A | Discharge status: Home / Self Care | Payer: Medicare PPO | Attending: Family | Admitting: Family

## 2023-04-24 ENCOUNTER — Other Ambulatory Visit: Payer: Self-pay

## 2023-04-24 DIAGNOSIS — N390 Urinary tract infection, site not specified: Secondary | ICD-10-CM | POA: Insufficient documentation

## 2023-04-24 DIAGNOSIS — H6692 Otitis media, unspecified, left ear: Secondary | ICD-10-CM | POA: Insufficient documentation

## 2023-04-24 DIAGNOSIS — H7292 Unspecified perforation of tympanic membrane, left ear: Secondary | ICD-10-CM | POA: Insufficient documentation

## 2023-04-24 LAB — URINALYSIS, MACRO/MICRO
BILIRUBIN: NEGATIVE mg/dL
BLOOD: NEGATIVE mg/dL
GLUCOSE: >=1000 mg/dL — AB
KETONES: NEGATIVE mg/dL
NITRITE: POSITIVE — AB
PH: 6 (ref 4.6–8.0)
PROTEIN: NEGATIVE mg/dL
SPECIFIC GRAVITY: 1.015 (ref 1.003–1.035)
UROBILINOGEN: 0.2 mg/dL (ref 0.2–1.0)

## 2023-04-24 LAB — URINALYSIS, MICROSCOPIC

## 2023-04-24 MED ORDER — CIPROFLOXACIN 0.3 % EYE DROPS
OPHTHALMIC | Status: AC
Start: 2023-04-24 — End: 2023-04-24
  Filled 2023-04-24: qty 5

## 2023-04-24 MED ORDER — CEFTRIAXONE 1 GRAM SOLUTION FOR INJECTION
INTRAMUSCULAR | Status: AC
Start: 2023-04-24 — End: 2023-04-24
  Filled 2023-04-24: qty 10

## 2023-04-24 MED ORDER — LIDOCAINE HCL 10 MG/ML (1 %) INJECTION SOLUTION
1.0000 g | Freq: Once | INTRAMUSCULAR | Status: AC
Start: 2023-04-24 — End: 2023-04-24
  Administered 2023-04-24: 1 g via INTRAMUSCULAR

## 2023-04-24 MED ORDER — OFLOXACIN 0.3 % EAR DROPS
10.0000 [drp] | Freq: Two times a day (BID) | OTIC | 0 refills | Status: AC
Start: 2023-04-24 — End: 2023-05-08

## 2023-04-24 MED ORDER — CEFDINIR 300 MG CAPSULE: 300.0000 mg | ORAL_CAPSULE | Freq: Two times a day (BID) | ORAL | 0 refills | Status: DC

## 2023-04-24 MED ORDER — CIPROFLOXACIN 0.3 % EYE DROPS
3.0000 [drp] | Freq: Once | OPHTHALMIC | Status: AC
Start: 2023-04-24 — End: 2023-04-24
  Administered 2023-04-24: 3 [drp] via OTIC

## 2023-04-24 MED ORDER — KETOROLAC 10 MG TABLET: 10.0000 mg | ORAL_TABLET | Freq: Three times a day (TID) | ORAL | 0 refills | Status: DC | PRN

## 2023-04-24 NOTE — ED Provider Notes (Signed)
[facility] Emergency Department  ED Primary Provider Note  History of Present Illness   Chief Complaint   Patient presents with    Ear Pain     Arrival: The patient arrived by Car  Brandy Flores is a 67 y.o. female who had concerns including Ear Pain. Pt states she went to the beach and left ear drum ruptured and has in the past before. Has had yellow DC from it and pain. Wears a hearing aid in ear normally from old perf. States also have dysuria. Denies fever nor flank pain.   Review of Systems   Constitutional: No fever, chills or weakness   Skin: No rash or diaphoresis  HENT: No headaches, or congestion+ ear pain .  Eyes: No vision changes or photophobia   Cardio: No chest pain, palpitations or leg swelling   Respiratory: No cough, wheezing or SOB  GI:  No nausea, vomiting or stool changes  GU:  +dysuria, no hematuria, or increased frequency  MSK: No muscle aches, joint or back pain  Neuro: No seizures, LOC, numbness, tingling, or focal weakness  Psychiatric: No depression, SI or substance abuse  All other systems reviewed and are negative.    History Reviewed This Encounter: all noted and reviewed.     Physical Exam   ED Triage Vitals [04/24/23 1304]   BP (Non-Invasive) (!) 151/83   Heart Rate 83   Respiratory Rate 18   Temperature 36.4 C (97.6 F)   SpO2 95 %   Weight 69.9 kg (154 lb)   Height 1.626 m (5\' 4" )       Constitutional:  67 y.o. female who appears in no distress. Normal color, no cyanosis.   HENT:   Head: Normocephalic and atraumatic.   Mouth/Throat: Oropharynx is clear and moist. Left tm central perforation moderate redness scant yellow DC in canal.   Eyes: EOMI, PERRL   Neck: Trachea midline. Neck supple.  Cardiovascular: RRR, No murmurs, rubs or gallops. Intact distal pulses.  Pulmonary/Chest: BS equal bilaterally. No respiratory distress. No wheezes, rales or chest tenderness.   Abdominal: Bowel sounds present and normal. Abdomen soft, no tenderness,  no rebound and no guarding.  Back: No midline spinal tenderness, no paraspinal tenderness, no CVA tenderness.           Musculoskeletal: No edema, tenderness or deformity.  Skin: warm and dry. No rash, erythema, pallor or cyanosis  Psychiatric: normal mood and affect. Behavior is normal.   Neurological: Patient keenly alert and responsive, easily able to raise eyebrows, facial muscles/expressions symmetric, speaking in fluent sentences, moving all extremities equally and fully, normal gait  Patient Data     Labs Ordered/Reviewed   URINALYSIS, MACRO/MICRO - Abnormal; Notable for the following components:       Result Value    LEUKOCYTES Trace (*)     NITRITE Positive (*)     GLUCOSE >=1000 (*)     All other components within normal limits   URINALYSIS, MICROSCOPIC - Abnormal; Notable for the following components:    RBCS 11-15 (*)     BACTERIA Few (*)     WBCS 11-15 (*)     WHITE BLOOD CELL CLUMP Few (*)     All other components within normal limits   URINE CULTURE,ROUTINE   URINALYSIS WITH REFLEX MICROSCOPIC AND CULTURE IF POSITIVE    Narrative:     The following orders were created for panel order URINALYSIS WITH REFLEX MICROSCOPIC AND CULTURE IF POSITIVE.  Procedure                               Abnormality         Status                     ---------                               -----------         ------                     URINALYSIS, MACRO/MICRO[[id number]]      Abnormal            Final result               URINALYSIS, MICROSCOPIC[[id number]]      Abnormal            Final result                 Please view results for these tests on the individual orders.     No orders to display     Medical Decision Making   Diff dx om O2 tm perf. UTI stone urine is abn. Pt encouraged to see ENT and leave hearing aid out until ear has been rechecked and healed.  She voiced understanding .  ED Course as of 04/24/23 1342   Sun Apr 24, 2023   1337 LEUKOCYTES(!): Trace   1337 NITRITE(!): Positive   1337 GLUCOSE(!): >=1000   1337  RBC'S(!): 11-15   1338 BACTERIA(!): Few   1338 WBC'S(!): 11-15            Medications Administered in the ED   cefTRIAXone (ROCEPHIN) 1 g in lidocaine 2.86 mL (tot vol) IM injection (1 g IntraMUSCULAR Given 04/24/23 1326)   ciprofloxacin (CILOXIN) 0.3% ophthalmic solution (3 Drops Left Ear Given 04/24/23 1326)     Clinical Impression   UTI (urinary tract infection) (Primary)   Perforated left tympanic membrane on examination   Left otitis media, unspecified otitis media type       Disposition: Discharged

## 2023-04-24 NOTE — ED Triage Notes (Signed)
Patient was at the beach about 1 week ago and felt a pop in her left ear and it started draining yellow. It has continued to drain patient has cotton in ear.  Itching and burning with urination.

## 2023-04-27 LAB — URINE CULTURE,ROUTINE: URINE CULTURE: >100000 — AB

## 2023-08-31 ENCOUNTER — Other Ambulatory Visit (HOSPITAL_COMMUNITY): Payer: Self-pay

## 2023-08-31 DIAGNOSIS — Z1231 Encounter for screening mammogram for malignant neoplasm of breast: Secondary | ICD-10-CM

## 2023-09-15 ENCOUNTER — Encounter (HOSPITAL_COMMUNITY): Payer: Self-pay

## 2023-09-15 ENCOUNTER — Other Ambulatory Visit: Payer: Self-pay

## 2023-09-15 ENCOUNTER — Ambulatory Visit
Admission: RE | Admit: 2023-09-15 | Discharge: 2023-09-15 | Disposition: A | Discharge status: Home / Self Care | Payer: Medicare PPO | Source: Ambulatory Visit

## 2023-09-15 DIAGNOSIS — Z1231 Encounter for screening mammogram for malignant neoplasm of breast: Secondary | ICD-10-CM | POA: Insufficient documentation

## 2023-11-13 ENCOUNTER — Emergency Department (HOSPITAL_BASED_OUTPATIENT_CLINIC_OR_DEPARTMENT_OTHER)

## 2023-11-13 ENCOUNTER — Other Ambulatory Visit: Payer: Self-pay

## 2023-11-13 ENCOUNTER — Emergency Department
Admission: EM | Admit: 2023-11-13 | Discharge: 2023-11-13 | Disposition: A | Discharge status: Home / Self Care | Attending: Emergency Medicine | Admitting: Emergency Medicine

## 2023-11-13 ENCOUNTER — Encounter (HOSPITAL_BASED_OUTPATIENT_CLINIC_OR_DEPARTMENT_OTHER): Payer: Self-pay

## 2023-11-13 DIAGNOSIS — K5900 Constipation, unspecified: Secondary | ICD-10-CM | POA: Insufficient documentation

## 2023-11-13 DIAGNOSIS — R32 Unspecified urinary incontinence: Secondary | ICD-10-CM | POA: Insufficient documentation

## 2023-11-13 LAB — URINALYSIS, MACRO/MICRO
BILIRUBIN: NEGATIVE mg/dL
BLOOD: NEGATIVE mg/dL
GLUCOSE: >=1000 mg/dL — AB
KETONES: NEGATIVE mg/dL
LEUKOCYTES: NEGATIVE WBCs/uL
NITRITE: NEGATIVE
PH: 6 (ref 4.6–8.0)
PROTEIN: NEGATIVE mg/dL
SPECIFIC GRAVITY: 1.01 (ref 1.003–1.035)
UROBILINOGEN: 0.2 mg/dL (ref 0.2–1.0)

## 2023-11-13 MED ORDER — OXYBUTYNIN CHLORIDE 5 MG TABLET
ORAL_TABLET | ORAL | Status: AC
Start: 2023-11-13 — End: 2023-11-13
  Filled 2023-11-13: qty 1

## 2023-11-13 MED ORDER — OXYBUTYNIN CHLORIDE ER 10 MG TABLET,EXTENDED RELEASE 24 HR
10.0000 mg | EXTENDED_RELEASE_TABLET | Freq: Every day | ORAL | 0 refills | Status: AC
Start: 2023-11-13 — End: 2023-11-27

## 2023-11-13 MED ORDER — OXYBUTYNIN CHLORIDE 5 MG TABLET
5.0000 mg | ORAL_TABLET | ORAL | Status: AC
Start: 2023-11-13 — End: 2023-11-13
  Administered 2023-11-13: 5 mg via ORAL

## 2023-11-13 NOTE — ED Nurses Note (Signed)

## 2023-11-13 NOTE — ED Provider Notes (Addendum)
 [facility] emergency department         HISTORY OF PRESENT ILLNESS     Date:  11/13/2023  Patient's Name:  Brandy Flores  Date of Birth:  07-Mar-1956    Patient is a 68 year old presenting to the emergency room stating that she has been having difficulty with holding her urine for the past year.  Over last few weeks symptoms have worsening and over last few days she has been incontinent of urine.  Patient has had previous pelvic hip fracture.  Patient denies any nausea or vomiting she is having regular bowel movements.  She denies any hematuria        Review of Systems     Review of Systems   HENT: Negative.     Respiratory: Negative.     Gastrointestinal:  Positive for abdominal pain.   Genitourinary:  Positive for frequency, pelvic pain and urgency.   Musculoskeletal: Negative.    Hematological: Negative.    All other systems reviewed and are negative.      Previous History     Past Medical History:  Past Medical History:   Diagnosis Date    Arthropathy     Diabetes mellitus, type 2     Esophageal reflux     HTN (hypertension)     Hypercholesterolemia     Perforation of tympanic membrane     Renal cyst        Past Surgical History:  Past Surgical History:   Procedure Laterality Date    Anterior cervical discectomy w/ fusion      Hx cesarean section      Hx ear surgery      Knee arthroscopy Right        Social History:  Social History     Tobacco Use    Smoking status: Never     Passive exposure: Never    Smokeless tobacco: Never   Vaping Use    Vaping status: Never Used   Substance Use Topics    Alcohol use: Not Currently    Drug use: Never     Social History     Substance and Sexual Activity   Drug Use Never       Family History:  Family History   Problem Relation Age of Onset    Cancer Mother     Diabetes Father     Breast Cancer Sister         79's    No Known Problems Brother     No Known Problems Maternal Aunt     No Known Problems Maternal Uncle     No Known Problems  Paternal Aunt     No Known Problems Paternal Uncle     No Known Problems Maternal Grandmother     No Known Problems Maternal Grandfather     No Known Problems Paternal Grandmother     No Known Problems Paternal Grandfather     No Known Problems Daughter     No Known Problems Son     No Known Problems Other        Medication History:  Current Outpatient Medications   Medication Sig    cefdinir (OMNICEF) 300 mg Oral Capsule Take 1 Capsule (300 mg total) by mouth Twice daily    chlorTHALIDONE (HYGROTON) 25 mg Oral Tablet Take 1 Tablet (25 mg total) by mouth Once a day    empagliflozin (JARDIANCE) 10 mg Oral Tablet Take 1 Tablet (10 mg total)  by mouth Once a day    glipiZIDE (GLUCOTROL) 5 mg Oral Tablet Take 2 Tablets (10 mg total) by mouth Every morning before breakfast Take 30 minutes before meals    HYDROcodone-acetaminophen (NORCO) 10-325 mg Oral Tablet Take 1 Tablet by mouth Every 6 hours as needed for Pain    insulin glargine,hum.rec.anlog (BASAGLAR KWIKPEN U-100 INSULIN SUBQ) Inject under the skin    ketorolac tromethamine (TORADOL) 10 mg Oral Tablet Take 1 Tablet (10 mg total) by mouth Every 8 hours as needed for Pain    lisinopriL (PRINIVIL) 2.5 mg Oral Tablet Take 1 Tablet (2.5 mg total) by mouth Once a day    MetFORMIN (GLUCOPHAGE) 1,000 mg Oral Tablet Take 1 Tablet (1,000 mg total) by mouth Twice daily with food    nitroGLYCERIN (NITROSTAT) 0.4 mg Sublingual Tablet, Sublingual Place 1 Tablet (0.4 mg total) under the tongue Every 5 minutes as needed for Chest pain for 3 doses over 15 minutes    oxyBUTYnin chloride (DITROPAN XL) 10 mg Oral Tablet Extended Rel 24 hr Take 1 Tablet (10 mg total) by mouth Daily for 14 days    pantoprazole (PROTONIX) 40 mg Oral Tablet, Delayed Release (E.C.) Take 1 Tablet (40 mg total) by mouth Once a day    simvastatin (ZOCOR) 20 mg Oral Tablet Take 1 Tablet (20 mg total) by mouth Every evening       Allergies:  Allergies   Allergen Reactions    Augmentin [Amoxicillin-Pot  Clavulanate] Itching    Bactrim [Sulfamethoxazole-Trimethoprim] Itching    Morphine Nausea/ Vomiting    Tylenol-Codeine #3 [Acetaminophen-Codeine] Nausea/ Vomiting       Physical Exam     Vitals:    BP (!) 162/93   Pulse 97   Temp 36.2 C (97.1 F)   Resp 17   Ht 1.626 m (5\' 4" )   Wt 70.3 kg (155 lb)   SpO2 99%   BMI 26.61 kg/m           Physical Exam  Vitals and nursing note reviewed. Exam conducted with a chaperone present.   Constitutional:       General: She is not in acute distress.     Appearance: Normal appearance. She is well-developed.   HENT:      Head: Normocephalic and atraumatic.      Right Ear: Tympanic membrane, ear canal and external ear normal.      Left Ear: Tympanic membrane, ear canal and external ear normal.      Nose: Nose normal.      Mouth/Throat:      Mouth: Mucous membranes are moist.   Eyes:      Extraocular Movements: Extraocular movements intact.      Conjunctiva/sclera: Conjunctivae normal.      Pupils: Pupils are equal, round, and reactive to light.   Cardiovascular:      Rate and Rhythm: Normal rate and regular rhythm.      Heart sounds: No murmur heard.  Pulmonary:      Effort: Pulmonary effort is normal. No respiratory distress.      Breath sounds: Normal breath sounds.   Abdominal:      General: Bowel sounds are normal.      Palpations: Abdomen is soft.      Tenderness: There is no abdominal tenderness.   Musculoskeletal:         General: No swelling. Normal range of motion.      Cervical back: Normal range of motion and neck supple.  Skin:     General: Skin is warm and dry.      Capillary Refill: Capillary refill takes less than 2 seconds.   Neurological:      General: No focal deficit present.      Mental Status: She is alert and oriented to person, place, and time.   Psychiatric:         Mood and Affect: Mood normal.         Diagnostic Studies/Treatment     Medications:  Medications Ordered/Administered in the ED   oxyBUTYnin (DITROPAN) tablet (has no administration in  time range)       New Prescriptions    OXYBUTYNIN CHLORIDE (DITROPAN XL) 10 MG ORAL TABLET EXTENDED REL 24 HR    Take 1 Tablet (10 mg total) by mouth Daily for 14 days       Labs:    Results for orders placed or performed during the hospital encounter of 11/13/23 (from the past 12 hours)   URINALYSIS, MACRO/MICRO   Result Value Ref Range    COLOR Light Yellow Light Yellow, Yellow    APPEARANCE Clear Clear    SPECIFIC GRAVITY 1.010 1.003 - 1.035    PH 6.0 4.6 - 8.0    LEUKOCYTES Negative Negative WBCs/uL    NITRITE Negative Negative    PROTEIN Negative Negative mg/dL    GLUCOSE >=5409 (A) Negative mg/dL    KETONES Negative Negative mg/dL    BILIRUBIN Negative Negative mg/dL    BLOOD Negative Negative mg/dL    UROBILINOGEN 0.2 0.2 - 1.0 mg/dL        Radiology:  CT ABDOMEN PELVIS WO IV CONTRAST    CT ABDOMEN PELVIS WO IV CONTRAST   Final Result   No CT evidence of acute abdominal or pelvic process.      Moderate of stool present within the colon.      Nonacute findings as described above.               Radiologist location ID: [id number]             ECG:  NONE            Differential diagnosis  Urinary incontinence, pelvic pain, urinary tract infection    Course/Disposition/Plan     Course:    Urinalysis done CT abdomen pelvis.  Patient will refer to gyn for management of urinary incontinence    Disposition:    Discharged    Condition at Disposition:   Stable    Follow up:   Barnett Hatter, MD  [address] [facility]  [place] [address]  [phone]    Schedule an appointment as soon as possible for a visit in 1 week  If symptoms worsen gyn      Clinical Impression:     Clinical Impression   Urinary incontinence in female (Primary)   Constipation, unspecified constipation type         Tivis Ringer, MD

## 2024-01-08 ENCOUNTER — Encounter (HOSPITAL_COMMUNITY): Payer: Self-pay | Admitting: Emergency Medicine

## 2024-01-08 ENCOUNTER — Other Ambulatory Visit: Payer: Self-pay

## 2024-01-08 ENCOUNTER — Emergency Department
Admission: EM | Admit: 2024-01-08 | Discharge: 2024-01-08 | Disposition: A | Discharge status: Home / Self Care | Source: Home / Self Care | Attending: Emergency Medicine | Admitting: Emergency Medicine

## 2024-01-08 ENCOUNTER — Emergency Department (HOSPITAL_COMMUNITY)

## 2024-01-08 DIAGNOSIS — R0781 Pleurodynia: Secondary | ICD-10-CM

## 2024-01-08 DIAGNOSIS — S2341XA Sprain of ribs, initial encounter: Secondary | ICD-10-CM | POA: Insufficient documentation

## 2024-01-08 DIAGNOSIS — X58XXXA Exposure to other specified factors, initial encounter: Secondary | ICD-10-CM | POA: Insufficient documentation

## 2024-01-08 MED ORDER — PREDNISONE 10 MG TABLET
ORAL_TABLET | ORAL | Status: AC
Start: 2024-01-08 — End: 2024-01-08
  Filled 2024-01-08: qty 2

## 2024-01-08 MED ORDER — KETOROLAC 30 MG/ML (1 ML) INJECTION SOLUTION
30.0000 mg | INTRAMUSCULAR | Status: AC
Start: 2024-01-08 — End: 2024-01-08
  Administered 2024-01-08: 30 mg via INTRAVENOUS

## 2024-01-08 MED ORDER — PREDNISONE 20 MG TABLET
20.0000 mg | ORAL_TABLET | ORAL | Status: AC
Start: 2024-01-08 — End: 2024-01-08
  Administered 2024-01-08: 20 mg via ORAL

## 2024-01-08 MED ORDER — KETOROLAC 30 MG/ML (1 ML) INJECTION SOLUTION
INTRAMUSCULAR | Status: AC
Start: 2024-01-08 — End: 2024-01-08
  Filled 2024-01-08: qty 1

## 2024-01-08 MED ORDER — KETOROLAC 30 MG/ML (1 ML) INJECTION SOLUTION
15.0000 mg | INTRAMUSCULAR | Status: DC
Start: 2024-01-08 — End: 2024-01-08
  Administered 2024-01-08: 0 mg via INTRAVENOUS

## 2024-01-08 MED ORDER — PREDNISONE 20 MG TABLET: 20.0000 mg | ORAL_TABLET | Freq: Every day | ORAL | 0 refills | Status: DC

## 2024-01-08 MED ORDER — TRAMADOL 50 MG TABLET: 1.0000 | ORAL_TABLET | Freq: Four times a day (QID) | ORAL | 0 refills | Status: DC | PRN

## 2024-01-08 MED ORDER — MORPHINE 2 MG/ML INJECTION WRAPPER
2.0000 mg | INJECTION | INTRAMUSCULAR | Status: DC
Start: 2024-01-08 — End: 2024-01-08
  Administered 2024-01-08: 0 mg via INTRAVENOUS

## 2024-01-08 MED ORDER — ONDANSETRON HCL (PF) 4 MG/2 ML INJECTION SOLUTION
INTRAMUSCULAR | Status: AC
Start: 2024-01-08 — End: 2024-01-08
  Filled 2024-01-08: qty 2

## 2024-01-08 MED ORDER — ONDANSETRON HCL (PF) 4 MG/2 ML INJECTION SOLUTION
4.0000 mg | INTRAMUSCULAR | Status: AC
Start: 2024-01-08 — End: 2024-01-08
  Administered 2024-01-08: 4 mg via INTRAVENOUS

## 2024-01-08 NOTE — Discharge Instructions (Signed)
 Based on the radiologist interpretation of your left rib x-ray, that is no fracture.  Based on the history and physical examination, I do not believe that you have any internal organ injury.  Take the prednisone and tramadol  as prescribed and follow up with your primary care physician as needed for re-evaluation.

## 2024-01-08 NOTE — ED Triage Notes (Signed)
 Pt presents to ED c/o increased pain around left rib cage radiating into her back. Pt states pain woke her up out of sleep. Denies any injury.

## 2024-01-08 NOTE — ED Provider Notes (Signed)
 [place]  DEPARTMENT OF EMERGENCY MEDICINE  EMERGENCY DEPARTMENT HISTORY AND PHYSICAL      Chief Complaint:    Patient is a 68 y.o.  female presenting to the ED with chief complaint of left ribcage pain nontraumatic     History of Present Illness:  Patient states while she was trying to get in bed about 2 hours ago she felt sudden onset of sharp stabbing pain left anterior lateral ribcage.  Pain is aggravated by range of motion and twisting of torso, is 100% palpable.  Patient is reluctant to move as that elicits severe pain.  No shortness of breath and no chest pain.      Past Medical History:  Past Medical History:   Diagnosis Date    Arthropathy     Diabetes mellitus, type 2     Esophageal reflux     HTN (hypertension)     Hypercholesterolemia     Perforation of tympanic membrane     Renal cyst      Past Surgical History:   Procedure Laterality Date    Anterior cervical discectomy w/ fusion      Hx cesarean section      Hx ear surgery      Knee arthroscopy Right      Above history reviewed with patient.  Allergies, medication list, and old records also reviewed.   Problem List[1]    Social History:  Social History[2]    Review of Systems:  Constitutional: No fever, or chills  Skin: No rashes or lesions  Resp: Denies cough, or wheezes  Card: Denies chest pain or palpitations  GI: Denies nausea, vomiting, or diarrhea  MSK:  Left ribcage pain  Neuro: Denies any focal weakness  All other systems were reviewed and were negative except for what is mentioned in the HPI.    Physical Exam:   Filed Vitals:    01/08/24 0331 01/08/24 0606   BP: (!) 150/79 126/64   Pulse: 94 79   Resp: 18 17   Temp: 36.9 C (98.4 F)    SpO2: 100%      Nursing note and vitals reviewed.  Vital signs reviewed as above. No acute distress.   Constitutional: Pt is well-developed and well-nourished.   Head: Normocephalic and atraumatic.   Eyes: Conjunctivae are normal. Pupils are equal, round, and reactive to light.   Mouth: Moist, without  erythema  Heart: Normal rate  Pulmonary/Chest: Non-labored respirations, clear to auscultation.  Positive tenderness diffusely left anterolateral ribcage.  Tender to palpation and movement.  No visible trauma, no crepitus, no paradoxical movement.  Abdomen: Soft, active bowel sounds  Musculoskeletal:  No obvious deformities.   Neurological: Alert and oriented, no obvious focal deficits  Skin: No rash  Psychiatric: Patient has a normal mood and affect.       Labs:  No results found for any visits on 01/08/24.    Imaging:  Results for orders placed or performed during the hospital encounter of 01/08/24 (from the past 72 hours)   XR RIBS LEFT W PA/AP CHEST     Status: None    Narrative    Felicity Fargo    RADIOLOGIST: Nicki Barnacle    XR RIBS LEFT W PA/AP CHEST performed on 01/08/2024 5:50 AM    CLINICAL HISTORY: Left ribcage pain.    TECHNIQUE:  Frontal and oblique views of the left ribs.  Frontal view of the chest.    COMPARISON: 04/16/2022    FINDINGS:  The lungs are clear and symmetrically aerated. The heart size and mediastinal contours are normal. The pulmonary vascularity is normal. There is no pleural effusion or pneumothorax. No acute bony abnormality is visualized in frontal projection.     No displaced rib fractures are identified.      Impression    NO EVIDENCE OF DISPLACED LEFT RIB FRACTURE.    CLEAR LUNGS.          Radiologist location ID: [id number]         Orders Placed This Encounter    XR RIBS LEFT W PA/AP CHEST    morphine  2 mg/mL injection    ketorolac  (TORADOL ) 30 mg/mL injection    ondansetron  (ZOFRAN ) 2 mg/mL injection    ketorolac  (TORADOL ) 30 mg/mL injection    predniSONE (DELTASONE) tablet    traMADoL  (ULTRAM ) 50 mg Oral Tablet    predniSONE (DELTASONE) 20 mg Oral Tablet         MDM:  ED Course as of 01/08/24 0619   Sun Jan 08, 2024   0607 Discussed the negative left rib x-ray with the patient.  Recommend a follow up with the primary care physician for re-evaluation of the problem  continuity.         During the patient's stay in the emergency department, the above listed imaging and/or labs were performed to assist with medical decision making and were reviewed by myself as available for review.   Patient rechecked and remained stable throughout the emergency department course.     Results discussed with patient.   All questions/concerns addressed, and patient agrees with disposition plan.   Advised that patient return to ED immediately for any new or worsening symptoms; otherwise, follow up with PCP.    IMPRESSION:   Clinical Impression   Sprain of ribs (Primary)   Rib pain on left side           DISPOSITION/PLAN:    Discharged  Melvinia Stager, MD  [address] [facility]  [place] [address]  [phone]    In 3 days  ED FOLLOW-UP    Melvinia Stager, MD  [address] [facility]  [place] [address]  [phone]    In 3 days  ED FOLLOW-UP                         [1]   Patient Active Problem List  Diagnosis    Closed left hip fracture    Diabetes mellitus type 2, noninsulin dependent (CMS HCC)    GERD without esophagitis    Food impaction of esophagus    Hypercholesterolemia    Benign essential HTN    Grief reaction    Fatigue    UTI (urinary tract infection)   [2]   Social History  Tobacco Use    Smoking status: Never     Passive exposure: Never    Smokeless tobacco: Never   Vaping Use    Vaping status: Never Used   Substance Use Topics    Alcohol use: Not Currently    Drug use: Never

## 2024-01-08 NOTE — ED Nurses Note (Signed)
 Patient discharged home with family.  AVS reviewed with patient/care giver.  A written copy of the AVS and discharge instructions was given to the patient/care giver. Scripts handed to patient/care giver. Questions sufficiently answered as needed.  Patient/care giver encouraged to follow up with PCP as indicated.  In the event of an emergency, patient/care giver instructed to call 911 or go to the nearest emergency room.

## 2024-07-28 ENCOUNTER — Encounter (HOSPITAL_BASED_OUTPATIENT_CLINIC_OR_DEPARTMENT_OTHER): Payer: Self-pay

## 2024-07-28 ENCOUNTER — Other Ambulatory Visit: Payer: Self-pay

## 2024-07-28 ENCOUNTER — Inpatient Hospital Stay (HOSPITAL_COMMUNITY)

## 2024-07-28 ENCOUNTER — Observation Stay: Admission: EM | Admit: 2024-07-28 | Discharge: 2024-07-29 | Disposition: A | Discharge status: Home / Self Care

## 2024-07-28 DIAGNOSIS — K529 Noninfective gastroenteritis and colitis, unspecified: Secondary | ICD-10-CM

## 2024-07-28 DIAGNOSIS — I10 Essential (primary) hypertension: Secondary | ICD-10-CM | POA: Insufficient documentation

## 2024-07-28 DIAGNOSIS — R531 Weakness: Secondary | ICD-10-CM | POA: Insufficient documentation

## 2024-07-28 DIAGNOSIS — Z7984 Long term (current) use of oral hypoglycemic drugs: Secondary | ICD-10-CM | POA: Insufficient documentation

## 2024-07-28 DIAGNOSIS — N390 Urinary tract infection, site not specified: Secondary | ICD-10-CM | POA: Insufficient documentation

## 2024-07-28 DIAGNOSIS — E7800 Pure hypercholesterolemia, unspecified: Secondary | ICD-10-CM | POA: Insufficient documentation

## 2024-07-28 DIAGNOSIS — R112 Nausea with vomiting, unspecified: Secondary | ICD-10-CM | POA: Diagnosis present

## 2024-07-28 DIAGNOSIS — R9431 Abnormal electrocardiogram [ECG] [EKG]: Secondary | ICD-10-CM | POA: Insufficient documentation

## 2024-07-28 DIAGNOSIS — E119 Type 2 diabetes mellitus without complications: Secondary | ICD-10-CM | POA: Insufficient documentation

## 2024-07-28 DIAGNOSIS — Z7985 Long-term (current) use of injectable non-insulin antidiabetic drugs: Secondary | ICD-10-CM | POA: Insufficient documentation

## 2024-07-28 DIAGNOSIS — K760 Fatty (change of) liver, not elsewhere classified: Secondary | ICD-10-CM

## 2024-07-28 DIAGNOSIS — R197 Diarrhea, unspecified: Secondary | ICD-10-CM | POA: Insufficient documentation

## 2024-07-28 DIAGNOSIS — Z79899 Other long term (current) drug therapy: Secondary | ICD-10-CM | POA: Insufficient documentation

## 2024-07-28 DIAGNOSIS — K5730 Diverticulosis of large intestine without perforation or abscess without bleeding: Secondary | ICD-10-CM

## 2024-07-28 LAB — CBC WITH DIFF
BASOPHIL #: 0.03 x10ˆ3/uL (ref 0.00–0.10)
BASOPHIL %: 0 % (ref 0–1)
EOSINOPHIL #: 0.17 x10ˆ3/uL (ref 0.00–0.50)
EOSINOPHIL %: 2 % (ref 1–7)
HCT: 42.5 % — ABNORMAL HIGH (ref 31.2–41.9)
HGB: 13.5 g/dL (ref 10.9–14.3)
LYMPHOCYTE #: 2.3 x10ˆ3/uL (ref 1.10–3.10)
LYMPHOCYTE %: 31 % (ref 16–46)
MCH: 28.7 pg (ref 24.7–32.8)
MCHC: 31.8 g/dL — ABNORMAL LOW (ref 32.3–35.6)
MCV: 90.3 fL (ref 75.5–95.3)
MONOCYTE #: 0.6 x10ˆ3/uL (ref 0.20–0.90)
MONOCYTE %: 8 % (ref 4–11)
MPV: 7.6 fL — ABNORMAL LOW (ref 7.9–10.8)
NEUTROPHIL #: 4.39 x10ˆ3/uL (ref 1.90–8.20)
NEUTROPHIL %: 59 % (ref 43–77)
PLATELETS: 216 x10ˆ3/uL (ref 140–440)
RBC: 4.71 x10ˆ6/uL (ref 3.63–4.92)
RDW: 16.9 % (ref 12.3–17.7)
WBC: 7.5 x10ˆ3/uL (ref 3.8–11.8)

## 2024-07-28 LAB — COVID-19, FLU A/B, RSV RAPID BY PCR
INFLUENZA VIRUS TYPE A: NOT DETECTED
INFLUENZA VIRUS TYPE B: NOT DETECTED
RESPIRATORY SYNCTIAL VIRUS (RSV): NOT DETECTED
SARS-CoV-2: NOT DETECTED

## 2024-07-28 LAB — COMPREHENSIVE METABOLIC PANEL, NON-FASTING
ALBUMIN/GLOBULIN RATIO: 1.1 (ref 0.8–1.4)
ALBUMIN: 3.8 g/dL (ref 3.4–5.0)
ALKALINE PHOSPHATASE: 85 U/L (ref 46–116)
ALT (SGPT): 28 U/L (ref <=78)
ANION GAP: 14 mmol/L — ABNORMAL HIGH (ref 4–13)
AST (SGOT): 23 U/L (ref 15–37)
BILIRUBIN TOTAL: 0.6 mg/dL (ref 0.2–1.0)
BUN/CREA RATIO: 10
BUN: 9 mg/dL (ref 7–18)
CALCIUM, CORRECTED: 9.4 mg/dL
CALCIUM: 9.2 mg/dL (ref 8.5–10.1)
CHLORIDE: 104 mmol/L (ref 98–107)
CO2 TOTAL: 23 mmol/L (ref 21–32)
CREATININE: 0.87 mg/dL (ref 0.55–1.02)
ESTIMATED GFR: 72 mL/min/1.73mˆ2 (ref >59)
GLOBULIN: 3.6
GLUCOSE: 223 mg/dL — ABNORMAL HIGH (ref 74–106)
OSMOLALITY, CALCULATED: 287 mosm/kg (ref 270–290)
POTASSIUM: 3.6 mmol/L (ref 3.5–5.1)
PROTEIN TOTAL: 7.4 g/dL (ref 6.4–8.2)
SODIUM: 141 mmol/L (ref 136–145)

## 2024-07-28 LAB — LIPASE: LIPASE: 70 U/L (ref 15–77)

## 2024-07-28 LAB — URINALYSIS, MACRO/MICRO
BILIRUBIN: NEGATIVE mg/dL
GLUCOSE: NEGATIVE mg/dL
KETONES: NEGATIVE mg/dL
NITRITE: NEGATIVE
PH: 6 (ref 4.6–8.0)
PROTEIN: NEGATIVE mg/dL
SPECIFIC GRAVITY: <=1.005 (ref 1.003–1.035)
UROBILINOGEN: 0.2 mg/dL (ref 0.2–1.0)

## 2024-07-28 LAB — POC BLOOD GLUCOSE (RESULTS)
GLUCOSE, POC: 164 mg/dL — ABNORMAL HIGH (ref 70–100)
GLUCOSE, POC: 134 mg/dL — ABNORMAL HIGH (ref 70–100)
GLUCOSE, POC: 161 mg/dL — ABNORMAL HIGH (ref 70–100)
GLUCOSE, POC: 190 mg/dL — ABNORMAL HIGH (ref 70–100)

## 2024-07-28 LAB — HIV 1 AND 2 RAPID SCREEN
HIV-1/2 ANTIBODY SCREEN: NONREACTIVE
HIV1-p24 ANTIGEN SCREEN: NONREACTIVE

## 2024-07-28 LAB — URINALYSIS, MICROSCOPIC: WBCS: >50 /HPF — AB

## 2024-07-28 LAB — MAGNESIUM: MAGNESIUM: 1.1 mg/dL — ABNORMAL LOW (ref 1.8–2.4)

## 2024-07-28 LAB — HGA1C (HEMOGLOBIN A1C WITH EST AVG GLUCOSE): HEMOGLOBIN A1C: 7.9 % — ABNORMAL HIGH (ref 4.0–6.0)

## 2024-07-28 MED ORDER — INSULIN LISPRO 100 UNIT/ML SUB-Q SSIP VIAL
2.0000 [IU] | INJECTION | Freq: Four times a day (QID) | SUBCUTANEOUS | Status: DC
  Administered 2024-07-28: 0 [IU] via SUBCUTANEOUS
  Administered 2024-07-28 (×2): 2 [IU] via SUBCUTANEOUS
  Administered 2024-07-29: 5 [IU] via SUBCUTANEOUS
  Administered 2024-07-29: 2 [IU] via SUBCUTANEOUS
  Filled 2024-07-28 (×4): qty 2

## 2024-07-28 MED ORDER — SODIUM CHLORIDE 0.9 % INTRAVENOUS SOLUTION
INTRAVENOUS | Status: AC
  Filled 2024-07-28: qty 50

## 2024-07-28 MED ORDER — LISINOPRIL 5 MG TABLET
2.5000 mg | ORAL_TABLET | Freq: Every day | ORAL | Status: DC
  Administered 2024-07-28 – 2024-07-29 (×2): 2.5 mg via ORAL
  Filled 2024-07-28 (×2): qty 1

## 2024-07-28 MED ORDER — ATORVASTATIN 20 MG TABLET
20.0000 mg | ORAL_TABLET | Freq: Every evening | ORAL | Status: DC
  Administered 2024-07-28: 20 mg via ORAL
  Filled 2024-07-28: qty 1

## 2024-07-28 MED ORDER — SODIUM CHLORIDE 0.9 % (FLUSH) INJECTION SYRINGE: 3.0000 mL | INJECTION | INTRAMUSCULAR | Status: DC | PRN

## 2024-07-28 MED ORDER — IOHEXOL 350 MG IODINE/ML INTRAVENOUS SOLUTION
50.0000 mL | INTRAVENOUS | Status: AC
  Administered 2024-07-28: 75 mL via INTRAVENOUS

## 2024-07-28 MED ORDER — SODIUM CHLORIDE 0.9 % INTRAVENOUS SOLUTION
2.0000 g | INTRAVENOUS | Status: AC
  Administered 2024-07-28: 2 g via INTRAVENOUS
  Administered 2024-07-28: 0 g via INTRAVENOUS

## 2024-07-28 MED ORDER — DEXTROSE 50 % IN WATER (D50W) INTRAVENOUS SYRINGE: 12.5000 g | INJECTION | INTRAVENOUS | Status: DC | PRN

## 2024-07-28 MED ORDER — DEXTROSE 5% IN WATER (D5W) FLUSH BAG - 250 ML: INTRAVENOUS | Status: DC | PRN

## 2024-07-28 MED ORDER — SODIUM CHLORIDE 0.9% FLUSH BAG - 250 ML: INTRAVENOUS | Status: DC | PRN

## 2024-07-28 MED ORDER — OXYBUTYNIN CHLORIDE 5 MG TABLET
2.5000 mg | ORAL_TABLET | Freq: Two times a day (BID) | ORAL | Status: DC
  Administered 2024-07-28 – 2024-07-29 (×2): 2.5 mg via ORAL
  Filled 2024-07-28 (×2): qty 1

## 2024-07-28 MED ORDER — DEXTROSE 40 % ORAL GEL: 15.0000 g | ORAL | Status: DC | PRN

## 2024-07-28 MED ORDER — HYDROCODONE 10 MG-ACETAMINOPHEN 325 MG TABLET
1.0000 | ORAL_TABLET | Freq: Four times a day (QID) | ORAL | Status: DC | PRN
  Administered 2024-07-28 – 2024-07-29 (×2): 1 via ORAL
  Filled 2024-07-28 (×2): qty 1

## 2024-07-28 MED ORDER — TRAMADOL 50 MG TABLET: 50.0000 mg | ORAL_TABLET | Freq: Four times a day (QID) | ORAL | Status: DC | PRN

## 2024-07-28 MED ORDER — PANTOPRAZOLE 40 MG TABLET,DELAYED RELEASE
40.0000 mg | DELAYED_RELEASE_TABLET | Freq: Every day | ORAL | Status: DC
  Administered 2024-07-28 – 2024-07-29 (×2): 40 mg via ORAL
  Filled 2024-07-28 (×2): qty 1

## 2024-07-28 MED ORDER — GLUCAGON 1 MG SOLUTION FOR INJECTION: 1.0000 mg | Freq: Once | INTRAMUSCULAR | Status: DC | PRN

## 2024-07-28 MED ORDER — MAGNESIUM SULFATE 2 GRAM/50 ML (4 %) IN WATER INTRAVENOUS PIGGYBACK
INJECTION | INTRAVENOUS | Status: AC
  Filled 2024-07-28: qty 100

## 2024-07-28 MED ORDER — CEFTRIAXONE 2 GRAM SOLUTION FOR INJECTION
INTRAMUSCULAR | Status: AC
  Filled 2024-07-28: qty 20

## 2024-07-28 MED ORDER — SODIUM CHLORIDE 0.9 % IV BOLUS
1000.0000 mL | INJECTION | Status: AC
  Administered 2024-07-28: 1000 mL via INTRAVENOUS
  Administered 2024-07-28: 0 mL via INTRAVENOUS

## 2024-07-28 MED ORDER — ONDANSETRON HCL (PF) 4 MG/2 ML INJECTION SOLUTION
INTRAMUSCULAR | Status: AC
  Filled 2024-07-28: qty 2

## 2024-07-28 MED ORDER — ENOXAPARIN 40 MG/0.4 ML SUBCUTANEOUS SYRINGE
40.0000 mg | INJECTION | SUBCUTANEOUS | Status: DC
  Administered 2024-07-28 – 2024-07-29 (×2): 40 mg via SUBCUTANEOUS
  Filled 2024-07-28 (×2): qty 0.4

## 2024-07-28 MED ORDER — DAPAGLIFLOZIN PROPANEDIOL 10 MG TABLET
5.0000 mg | ORAL_TABLET | Freq: Every day | ORAL | Status: DC
  Administered 2024-07-28 – 2024-07-29 (×2): 5 mg via ORAL
  Filled 2024-07-28 (×2): qty 1

## 2024-07-28 MED ORDER — SODIUM CHLORIDE 0.9 % (FLUSH) INJECTION SYRINGE
3.0000 mL | INJECTION | Freq: Three times a day (TID) | INTRAMUSCULAR | Status: DC
  Administered 2024-07-28: 3 mL
  Administered 2024-07-28: 0 mL
  Administered 2024-07-29: 3 mL

## 2024-07-28 MED ORDER — MAGNESIUM SULFATE 2 GRAM/50 ML IN WATER IVPB PREMIX - Q1H X2 DEFAULT
4.0000 g | INJECTION | Freq: Once | INTRAVENOUS | Status: AC
  Administered 2024-07-28: 0 g via INTRAVENOUS
  Administered 2024-07-28: 4 g via INTRAVENOUS

## 2024-07-28 MED ORDER — ONDANSETRON HCL (PF) 4 MG/2 ML INJECTION SOLUTION
4.0000 mg | INTRAMUSCULAR | Status: AC
  Administered 2024-07-28: 4 mg via INTRAVENOUS

## 2024-07-28 NOTE — Nurses Notes (Signed)
 Medications verified with patients home pharmacy.

## 2024-07-28 NOTE — ED Provider Notes (Signed)
 [facility], [place] [facility] - Emergency Department  ED Primary Provider Note  HPI:  Brandy Flores is a 68 y.o. female   Patient complains of 3 months of nausea vomiting diarrhea.  She either has vomiting or diarrhea immediately after taking any meals she is barely eating at this point.  She has stooled on herself multiple times she says she can not take it anymore and feels extremely weak.  She has been working with her primary care provider to modify diabetic medications.  She is currently on Trulicity.  No blood in stool no significant travel.  Patient has a history of type 2 diabetes she is status post cholecystectomy.  Full code.  Partner provides collateral history      ROS review and negative aside from stated in HPI.    Physical Exam:  ED Triage Vitals [07/28/24 0247]   BP (Non-Invasive) (!) 145/79   Heart Rate 92   Respiratory Rate 18   Temperature 36.8 C (98.3 F)   SpO2 96 %   Weight 70.8 kg (156 lb)   Height 1.626 m (5' 4)     No acute distress.  Patient awake alert oriented x3.  Mood is appropriate.  Pupils 3 mm equal round reactive.  Extraocular movements are intact.  Oropharynx is clear.  Mucous membranes moist.  Trachea midline.  Neck is supple.  Heart has regular rate and rhythm without significant murmurs rubs or gallops.  Lungs are clear to auscultation.  Abdomen soft nontender, nondistended.  Moving all extremities without difficulty.  Generalized weakness 4/5 strength all extremities No rash no edema.      Patient data:  Labs Ordered/Reviewed   COMPREHENSIVE METABOLIC PANEL, NON-FASTING - Abnormal; Notable for the following components:       Result Value    ANION GAP 14 (*)     GLUCOSE 223 (*)     All other components within normal limits    Narrative:     Estimated Glomerular Filtration Rate (eGFR) is calculated using the CKD-EPI (2021) equation, intended for patients 84 years of age and older. If gender is not documented or unknown, there will be no eGFR calculation.   CBC  WITH DIFF - Abnormal; Notable for the following components:    HCT 42.5 (*)     MCHC 31.8 (*)     MPV 7.6 (*)     All other components within normal limits   URINALYSIS, MACRO/MICRO - Abnormal; Notable for the following components:    LEUKOCYTES Moderate (*)     BLOOD Trace (*)     All other components within normal limits   MAGNESIUM  - Abnormal; Notable for the following components:    MAGNESIUM  1.1 (*)     All other components within normal limits   URINALYSIS, MICROSCOPIC - Abnormal; Notable for the following components:    BACTERIA Moderate (*)     WBCS >50 (*)     All other components within normal limits   LIPASE - Normal   COVID-19, FLU A/B, RSV RAPID BY PCR - Normal    Narrative:     Results are for the simultaneous qualitative identification of SARS-CoV-2 (formerly 2019-nCoV), Influenza A, Influenza B, and RSV RNA. These etiologic agents are generally detectable in nasopharyngeal and nasal swabs during the ACUTE PHASE of infection. Hence, this test is intended to be performed on respiratory specimens collected from individuals with signs and symptoms of upper respiratory tract infection who meet Centers for Disease Control and Prevention (CDC) clinical  and/or epidemiological criteria for Coronavirus Disease 2019 (COVID-19) testing. CDC COVID-19 criteria for testing on human specimens is available at [facility] webpage information for Healthcare Professionals: Coronavirus Disease 2019 (COVID-19) ([email or web address].com.au).     False-negative results may occur if the virus has genomic mutations, insertions, deletions, or rearrangements or if performed very early in the course of illness. Otherwise, negative results indicate virus specific RNA targets are not detected, however negative results do not preclude SARS-CoV-2 infection/COVID-19, Influenza, or Respiratory syncytial virus infection. Results should not be used as the sole basis for patient management decisions. Negative  results must be combined with clinical observations, patient history, and epidemiological information. If upper respiratory tract infection is still suspected based on exposure history together with other clinical findings, re-testing should be considered.    Test methodology:   Cepheid Xpert Xpress SARS-CoV-2/Flu/RSV Assay real-time polymerase chain reaction (RT-PCR) test on the GeneXpert Dx and Xpert Xpress systems.   URINE CULTURE,ROUTINE   ADULT ROUTINE BLOOD CULTURE, SET OF 2 BOTTLES (BACTERIA AND YEAST)   ADULT ROUTINE BLOOD CULTURE, SET OF 2 BOTTLES (BACTERIA AND YEAST)   CBC/DIFF    Narrative:     The following orders were created for panel order CBC/DIFF.  Procedure                               Abnormality         Status                     ---------                               -----------         ------                     CBC WITH IPQQ[[id number]]                Abnormal            Final result                 Please view results for these tests on the individual orders.   URINALYSIS WITH REFLEX MICROSCOPIC AND CULTURE IF POSITIVE    Narrative:     The following orders were created for panel order URINALYSIS WITH REFLEX MICROSCOPIC AND CULTURE IF POSITIVE.  Procedure                               Abnormality         Status                     ---------                               -----------         ------                     URINALYSIS, MACRO/MICRO[[id number]]      Abnormal            Final result               URINALYSIS, MICROSCOPIC[[id number]]      Abnormal  Final result                 Please view results for these tests on the individual orders.   OCCULT BLOOD, STOOL   CALPROTECTIN,FECAL     No orders to display     EKG read by me shows a sinus rhythm with a rate of proximally 90, normal axis, QTC is 445.  I do not appreciate significant ST or T-wave changes no delta waves are S1 Q3 T3    MDM:  Weakness nausea vomiting diarrhea.  Diagnostic considerations include dehydration IBS  secretory diarrhea medication and Tylenol  electrolyte derangement.  Vitals are within normal limits.  Lab work notable for magnesium  1.1.  Urinalysis is consistent with a urinary tract infection.  Patient has a glucose of 223.  She is COVID influenza RSV negative normal lipase remainder of lab work grossly unremarkable.  No critical abnormality on EKG.  Patient did receive magnesium  supplementation was pancultured given IV fluids and ceftriaxone .  Case discussed with [facility] hospitalist [facility] would kindly accepted for admission      Admitted  Clinical Impression   Hypomagnesemia (Primary)   Weakness   Urinary tract infection   Diarrhea, unspecified type     Medications Ordered/Administered in the ED   NS flush syringe (has no administration in time range)   NS flush syringe (has no administration in time range)   NS 250 mL flush bag (has no administration in time range)     And   D5W 250 mL flush bag (has no administration in time range)   NS bolus infusion 1,000 mL (1,000 mL Intravenous New Bag/New Syringe 07/28/24 0302)   ondansetron  (ZOFRAN ) 2 mg/mL injection (4 mg Intravenous Given 07/28/24 0332)   magnesium  sulfate 2 G in SW 50 mL premix IVPB (4 g Intravenous New Bag/New Syringe 07/28/24 0354)   cefTRIAXone  (ROCEPHIN ) 2 g in NS 50 mL IVPB with adaptor (has no administration in time range)        Current Discharge Medication List        CONTINUE these medications - NO CHANGES were made during your visit.        Details   BASAGLAR KWIKPEN U-100 INSULIN  SUBQ   Inject under the skin  Refills: 0     cefdinir  300 mg Capsule  Commonly known as: OMNICEF    300 mg, Oral, 2 TIMES DAILY  Qty: 20 Capsule  Refills: 0     chlorTHALIDONE  25 mg Tablet  Commonly known as: HYGROTON    25 mg, Oral, Daily  Refills: 0     glipiZIDE 5 mg Tablet  Commonly known as: GLUCOTROL   10 mg, Oral, EVERY MORNING BEFORE BREAKFAST, Take 30 minutes before meals  Refills: 0     HYDROcodone -acetaminophen  10-325 mg Tablet  Commonly known as: NORCO    1 Tablet, Oral, EVERY 6 HOURS PRN  Refills: 0     Jardiance 10 mg Tablet  Generic drug: empagliflozin   10 mg, Oral, Daily  Refills: 0     ketorolac  tromethamine  10 mg Tablet  Commonly known as: TORADOL    10 mg, Oral, EVERY 8 HOURS PRN  Qty: 10 Tablet  Refills: 0     lisinopriL  2.5 mg Tablet  Commonly known as: PRINIVIL    2.5 mg, Oral, Daily  Refills: 0     MetFORMIN 1,000 mg Tablet  Commonly known as: GLUCOPHAGE   1,000 mg, Oral, 2 TIMES DAILY WITH FOOD  Refills: 0     nitroGLYCERIN  0.4 mg Tablet, Sublingual  Commonly known as: NITROSTAT   0.4 mg, Sublingual, EVERY 5 MIN PRN, for 3 doses over 15 minutes  Refills: 0     pantoprazole  40 mg Tablet, Delayed Release (E.C.)  Commonly known as: PROTONIX    40 mg, Oral, Daily  Refills: 0     predniSONE  20 mg Tablet  Commonly known as: DELTASONE    20 mg, Oral, Daily  Qty: 4 Tablet  Refills: 0     simvastatin 20 mg Tablet  Commonly known as: ZOCOR   20 mg, Oral, EVERY EVENING  Refills: 0     traMADoL  50 mg Tablet  Commonly known as: ULTRAM    50 mg, Oral, EVERY 6 HOURS PRN  Qty: 15 Tablet  Refills: 0

## 2024-07-28 NOTE — ED Nurses Note (Signed)
 Report called to Sari at [facility]. All questions and concerns answered. Recent lab values and imaging results told to oncoming nurse. [id number]. [id number]. Awaiting transport. Plan of care ongoing.

## 2024-07-28 NOTE — Assessment & Plan Note (Signed)
 Place on accu-checks with [facility] at this time.

## 2024-07-28 NOTE — Assessment & Plan Note (Signed)
 Symptoms present for last 3 months since starting new medications (ozempic/Trulicity)   HA1C 7.9.  HIV negative.   Stool studies ordered (GI biofire panel, osmo, wbc, lactoferrin, electrolytes, lipids, calprotectin)  Blood cultures pending.   Urine culture pending (no reported symptoms)

## 2024-07-28 NOTE — Care Plan (Signed)
 Patient admitted to floor from [facility] ED. Patient presents A&O x4 and family at bedside. VSS and no acute signs of distress noted presently. All questions answered. Bed alarm set and all equipment and assistive devices within reach including call bell.

## 2024-07-28 NOTE — H&P (Signed)
 [facility]  Admission H&P    Date of Service:  07/28/2024  Brandy Flores, 68 y.o. female  Encounter Start Date:  07/28/2024  Inpatient Admission Date: 07/28/2024  Date of Birth:  [birth year]  PCP: Dorothe GORMAN Blanch, MD  LAY CAREGIVER   Appointed Lay Caregiver?: I Decline   Information Obtained from: patient and spouse  Chief Complaint:  n/v/d    HPI: Brandy Flores is a 68 y.o., White female who presents from [facility] with complaints of n/v/d for at least 3 months now. States she fell and broke her hip a while back, but they won't do surgery on it until she gets her sugar under control. So she went to PCP>has been trying different medications to get sugar under control. States n/v/d started when she was started on ozempic/Trulicity that she can't eat anything it either comes out by vomiting or diarrhea. States she didn't tolerate Jardiance either. Was able and has tolerated metformin. Asked if she has attempted any form of long-acting insulin , meal time coverage insulin > Patient states she doesn't want insulin  that is what caused her mom to loose her foot. However, also states she will take whatever she needs for now to get sugar under control so she can have her hip fixed.     PAST MEDICAL:    Past Medical History:   Diagnosis Date    Arthropathy     Diabetes mellitus, type 2     Esophageal reflux     HTN (hypertension)     Hypercholesterolemia     Perforation of tympanic membrane     Renal cyst     Past Surgical History:   Procedure Laterality Date    ANTERIOR CERVICAL DISCECTOMY W/ FUSION      HX CESAREAN SECTION      3    HX EAR SURGERY      KNEE ARTHROSCOPY Right         Medications Prior to Admission       Prescriptions    cefdinir  (OMNICEF ) 300 mg Oral Capsule    Take 1 Capsule (300 mg total) by mouth Twice daily    Patient not taking:  Reported on 07/28/2024    chlorTHALIDONE  (HYGROTON ) 25 mg Oral Tablet    Take 1 Tablet (25 mg total) by mouth Daily    dulaglutide (TRULICITY) 0.75 mg/0.5 mL  Subcutaneous Pen Injector    Inject 0.5 mL (0.75 mg total) under the skin Every 7 days Inject 0.75 mg (0.5 mL) under the skin once a week.    empagliflozin (JARDIANCE) 25 mg Oral Tablet    Take 1 Tablet (25 mg total) by mouth Daily    glipiZIDE (GLUCOTROL XL) 10 mg Oral Tablet Extended Rel 24 hr    Take 1 Tablet (10 mg total) by mouth Every morning with breakfast Take 2 tablets by mouth with breakfast    HYDROcodone -acetaminophen  (NORCO) 10-325 mg Oral Tablet    Take 1 Tablet by mouth Every 6 hours as needed for Pain    insulin  glargine,hum.rec.anlog (BASAGLAR KWIKPEN U-100 INSULIN  SUBQ)    Inject 20 Units under the skin Every night    ketorolac  tromethamine  (TORADOL ) 10 mg Oral Tablet    Take 1 Tablet (10 mg total) by mouth Every 8 hours as needed for Pain    Patient not taking:  Reported on 07/28/2024    lisinopriL  (PRINIVIL ) 2.5 mg Oral Tablet    Take 1 Tablet (2.5 mg total) by mouth Daily    MetFORMIN (  GLUCOPHAGE) 1,000 mg Oral Tablet    Take 1 Tablet (1,000 mg total) by mouth Twice daily with food Take one tablet by mouth twice daily in the morning and at bedtime    nitroGLYCERIN (NITROSTAT) 0.4 mg Sublingual Tablet, Sublingual    Place 1 Tablet (0.4 mg total) under the tongue Every 5 minutes as needed for Chest pain for 3 doses over 15 minutes    Patient not taking:  Reported on 07/28/2024    oxyBUTYnin  (DITROPAN  XL) 5 mg Oral Tablet Extended Rel 24 hr    Take 1 Tablet (5 mg total) by mouth Daily    pantoprazole  (PROTONIX ) 40 mg Oral Tablet, Delayed Release (E.C.)    Take 1 Tablet (40 mg total) by mouth Daily    predniSONE  (DELTASONE ) 20 mg Oral Tablet    Take 1 Tablet (20 mg total) by mouth Daily    Patient not taking:  Reported on 07/28/2024    simvastatin (ZOCOR) 20 mg Oral Tablet    Take 1 Tablet (20 mg total) by mouth Every evening    traMADoL  (ULTRAM ) 50 mg Oral Tablet    Take 1 Tablet (50 mg total) by mouth Every 6 hours as needed for Pain    Patient not taking:  Reported on 07/28/2024           Allergies[1]  Family History:  Family Medical History:       Problem Relation (Age of Onset)    Breast Cancer Sister    Cancer Mother    Diabetes Father    No Known Problems Brother, Maternal Aunt, Maternal Uncle, Paternal Aunt, Paternal Uncle, Maternal Grandmother, Maternal Grandfather, Paternal Grandmother, Paternal Grandfather, Daughter, Son, Other             Social History:  Social History[2]   Review of Systems:  Review of Systems   Constitutional:  Negative for chills and fever.   Respiratory:  Negative for shortness of breath.    Cardiovascular:  Negative for chest pain and leg swelling.   Gastrointestinal:  Positive for diarrhea, nausea and vomiting. Negative for abdominal pain.        Examination:  Temperature: 36.9 C (98.5 F) Heart Rate: 83 BP (Non-Invasive): (!) 151/89   Respiratory Rate: 18 SpO2: 100 %     Physical Exam  Constitutional:       General: She is not in acute distress.  Cardiovascular:      Rate and Rhythm: Normal rate and regular rhythm.   Pulmonary:      Effort: Pulmonary effort is normal. No respiratory distress.      Breath sounds: Normal breath sounds. No wheezing or rhonchi.   Abdominal:      General: Bowel sounds are normal. There is no distension.      Palpations: Abdomen is soft.      Tenderness: There is no abdominal tenderness.   Musculoskeletal:         General: No swelling.   Skin:     General: Skin is warm and dry.   Neurological:      General: No focal deficit present.      Mental Status: She is alert.     Labs:    I have reviewed all lab results.  Lab Results Today:    Results for orders placed or performed during the hospital encounter of 07/28/24 (from the past 24 hours)   COMPREHENSIVE METABOLIC PANEL, NON-FASTING   Result Value Ref Range    SODIUM 141 136 - 145  mmol/L    POTASSIUM 3.6 3.5 - 5.1 mmol/L    CHLORIDE 104 98 - 107 mmol/L    CO2 TOTAL 23 21 - 32 mmol/L    ANION GAP 14 (H) 4 - 13 mmol/L    BUN 9 7 - 18 mg/dL    CREATININE 9.12 9.44 - 1.02 mg/dL    BUN/CREA  RATIO 10     ESTIMATED GFR 72 >59 mL/min/1.83m2    ALBUMIN 3.8 3.4 - 5.0 g/dL    CALCIUM 9.2 8.5 - 89.8 mg/dL    GLUCOSE 776 (H) 74 - 106 mg/dL    ALKALINE PHOSPHATASE 85 46 - 116 U/L    ALT (SGPT) 28 <=78 U/L    AST (SGOT) 23 15 - 37 U/L    BILIRUBIN TOTAL 0.6 0.2 - 1.0 mg/dL    PROTEIN TOTAL 7.4 6.4 - 8.2 g/dL    ALBUMIN/GLOBULIN RATIO 1.1 0.8 - 1.4    OSMOLALITY, CALCULATED 287 270 - 290 mOsm/kg    CALCIUM, CORRECTED 9.4 mg/dL    GLOBULIN 3.6    LIPASE   Result Value Ref Range    LIPASE 70 15 - 77 U/L   CBC WITH DIFF   Result Value Ref Range    WBC 7.5 3.8 - 11.8 x103/uL    RBC 4.71 3.63 - 4.92 x106/uL    HGB 13.5 10.9 - 14.3 g/dL    HCT 57.4 (H) 68.7 - 41.9 %    MCV 90.3 75.5 - 95.3 fL    MCH 28.7 24.7 - 32.8 pg    MCHC 31.8 (L) 32.3 - 35.6 g/dL    RDW 83.0 87.6 - 82.2 %    PLATELETS 216 140 - 440 x103/uL    MPV 7.6 (L) 7.9 - 10.8 fL    NEUTROPHIL % 59 43 - 77 %    LYMPHOCYTE % 31 16 - 46 %    MONOCYTE % 8 4 - 11 %    EOSINOPHIL % 2 1 - 7 %    BASOPHIL % 0 0 - 1 %    NEUTROPHIL # 4.39 1.90 - 8.20 x103/uL    LYMPHOCYTE # 2.30 1.10 - 3.10 x103/uL    MONOCYTE # 0.60 0.20 - 0.90 x103/uL    EOSINOPHIL # 0.17 0.00 - 0.50 x103/uL    BASOPHIL # 0.03 0.00 - 0.10 x103/uL   MAGNESIUM    Result Value Ref Range    MAGNESIUM  1.1 (L) 1.8 - 2.4 mg/dL   RNCPI-80, FLU A/B, RSV RAPID BY PCR   Result Value Ref Range    SARS-CoV-2 Not Detected Not Detected    INFLUENZA VIRUS TYPE A Not Detected Not Detected    INFLUENZA VIRUS TYPE B Not Detected Not Detected    RESPIRATORY SYNCTIAL VIRUS (RSV) Not Detected Not Detected   URINALYSIS, MACRO/MICRO   Result Value Ref Range    COLOR Light Yellow Light Yellow, Yellow    APPEARANCE Clear Clear    SPECIFIC GRAVITY <=1.005 1.003 - 1.035    PH 6.0 4.6 - 8.0    LEUKOCYTES Moderate (A) Negative WBCs/uL    NITRITE Negative Negative    PROTEIN Negative Negative mg/dL    GLUCOSE Negative Negative mg/dL    KETONES Negative Negative mg/dL    BILIRUBIN Negative Negative mg/dL    BLOOD Trace  (A) Negative mg/dL    UROBILINOGEN 0.2 0.2 - 1.0 mg/dL   URINALYSIS, MICROSCOPIC   Result Value Ref Range    RBCS 0-3 0-3, Not Present /hpf  BACTERIA Moderate (A) Negative /hpf    MUCOUS Rare Rare, Occasional, Few /hpf    WBCS >50 (A) Not Present, Occasional, 0-5 /hpf    SQUAMOUS EPITHELIAL Few Not Present, Few /hpf   ECG 12 LEAD   Result Value Ref Range    Ventricular rate 93 BPM    Atrial Rate 93 BPM    PR Interval 176 ms    QRS Duration 68 ms    QT Interval 358 ms    QTC Calculation 445 ms    Calculated P Axis 63 degrees    Calculated R Axis 5 degrees    Calculated T Axis 48 degrees   POC BLOOD GLUCOSE (RESULTS)   Result Value Ref Range    GLUCOSE, POC 164 (H) 70 - 100 mg/dl   HIV 1 AND 2 RAPID SCREEN   Result Value Ref Range    HIV-1/2 ANTIBODY SCREEN Non-reactive Non-reactive    HIV1-p24 ANTIGEN SCREEN Non-reactive Non-reactive   HGA1C (HEMOGLOBIN A1C WITH EST AVG GLUCOSE)   Result Value Ref Range    HEMOGLOBIN A1C 7.9 (H) 4.0 - 6.0 %   POC BLOOD GLUCOSE (RESULTS)   Result Value Ref Range    GLUCOSE, POC 134 (H) 70 - 100 mg/dl     Imaging Studies:    No results found.  CT ABDOMEN PELVIS W IV CONTRAST   Final Result   Findings which can be seen with enteritis as described above.      Nonacute findings as described above.               Radiologist location ID: [id number]           DNR Status:  FULL CODE: ATTEMPT RESUSCITATION/CPR    Assessment/Plan:   Assessment & Plan  Chronic diarrhea  Nausea and vomiting  Symptoms present for last 3 months since starting new medications (ozempic/Trulicity)   HA1C 7.9.  HIV negative.   Stool studies ordered (GI biofire panel, osmo, wbc, lactoferrin, electrolytes, lipids, calprotectin)  Blood cultures pending.   Urine culture pending (no reported symptoms)  Diabetes mellitus type 2, noninsulin dependent (CMS HCC)  Place on accu-checks with issc at this time.   Hypercholesterolemia  Continue atorvastatin .   Benign essential HTN  Continue lisinopril         -further treatment  adjustments will be made based off clinical course.  See orders for further details.  -Hospitalist personally evaluated and examined the patient in conjunction with the MLP and agree with the assessments, treatment plan and disposition of the patient as recorded by the [facility].    DVT/PE Prophylaxis: lovenox     Daily Orders (From admission, onward)       Start     Ordered    07/28/24 0900  enoxaparin  PF (LOVENOX ) 40 mg/0.4 mL SubQ injection  (HIGH RISK VTE LEVEL)  EVERY 24 HOURS        Question:  Select Indication of Use  Answer:  VTE Prophylaxis    07/28/24 0824    07/28/24 0830  PT IS HIGH RISK FOR VENOUS THROMBOEMBOLISM  (HIGH RISK VTE LEVEL)  CONTINUOUS        References:    VTE RISK ASSESSMENT TOOL    07/28/24 0824                  Anticoagulants (last 24 hours)       Date/Time Action Medication Dose    07/28/24 0938 Given    enoxaparin  PF (LOVENOX ) 40 mg/0.4 mL SubQ  injection 40 mg             This note was partially generated using MModal Fluency Direct system, and there may be some incorrect words, spellings, and punctuation that were not noted in checking the note before saving.    Megan A Meadows FNP-BC       [1]   Allergies  Allergen Reactions    Augmentin [Amoxicillin-Pot Clavulanate] Itching    Bactrim [Sulfamethoxazole-Trimethoprim] Itching    Morphine  Nausea/ Vomiting    Tylenol -Codeine #3 [Acetaminophen -Codeine] Nausea/ Vomiting   [2]   Social History  Tobacco Use    Smoking status: Never     Passive exposure: Never    Smokeless tobacco: Never   Vaping Use    Vaping status: Never Used   Substance Use Topics    Alcohol use: Not Currently    Drug use: Never

## 2024-07-28 NOTE — ED Nurses Note (Signed)
 Pt resting on stretcher, respirations even and unlabored. No s/s of distress at this time. Plan of care ongoing. Call light in reach.

## 2024-07-28 NOTE — Assessment & Plan Note (Signed)
"  Continue atorvastatin   "

## 2024-07-28 NOTE — Assessment & Plan Note (Signed)
"      Continue lisinopril          "

## 2024-07-28 NOTE — ED Nurses Note (Signed)
 [facility] here at this time to transport pt to [facility] with personal belongings and paperwork. See previous note.

## 2024-07-28 NOTE — Nurses Notes (Signed)
 Attempted to call patients pharmacy twice. Will re-attempt later.

## 2024-07-29 DIAGNOSIS — Z7984 Long term (current) use of oral hypoglycemic drugs: Secondary | ICD-10-CM

## 2024-07-29 LAB — COMPREHENSIVE METABOLIC PANEL, NON-FASTING
ALBUMIN/GLOBULIN RATIO: 1.5 — ABNORMAL HIGH (ref 0.8–1.4)
ALBUMIN: 3.8 g/dL (ref 3.5–5.7)
ALKALINE PHOSPHATASE: 53 U/L (ref 34–104)
ALT (SGPT): 14 U/L (ref 7–52)
ANION GAP: 10 mmol/L (ref 4–13)
AST (SGOT): 20 U/L (ref 13–39)
BILIRUBIN TOTAL: 0.6 mg/dL (ref 0.3–1.0)
BUN/CREA RATIO: 12 (ref 6–22)
BUN: 9 mg/dL (ref 7–25)
CALCIUM, CORRECTED: 8.8 mg/dL — ABNORMAL LOW (ref 8.9–10.8)
CALCIUM: 8.6 mg/dL (ref 8.6–10.3)
CHLORIDE: 110 mmol/L — ABNORMAL HIGH (ref 98–107)
CO2 TOTAL: 21 mmol/L (ref 21–31)
CREATININE: 0.78 mg/dL (ref 0.60–1.30)
ESTIMATED GFR: 82 mL/min/1.73mˆ2 (ref >59)
GLOBULIN: 2.5 (ref 2.0–3.5)
GLUCOSE: 154 mg/dL — ABNORMAL HIGH (ref 74–109)
OSMOLALITY, CALCULATED: 283 mosm/kg (ref 270–290)
POTASSIUM: 3.8 mmol/L (ref 3.5–5.1)
PROTEIN TOTAL: 6.3 g/dL — ABNORMAL LOW (ref 6.4–8.9)
SODIUM: 141 mmol/L (ref 136–145)

## 2024-07-29 LAB — CBC WITH DIFF
BASOPHIL #: 0 x10ˆ3/uL (ref 0.00–0.10)
BASOPHIL %: 1 % (ref 0–1)
EOSINOPHIL #: 0.2 x10ˆ3/uL (ref 0.00–0.50)
EOSINOPHIL %: 4 % (ref 1–7)
HCT: 35.5 % (ref 31.2–41.9)
HGB: 12.5 g/dL (ref 10.9–14.3)
LYMPHOCYTE #: 2.7 x10ˆ3/uL (ref 1.10–3.10)
LYMPHOCYTE %: 46 % (ref 16–46)
MCH: 30.6 pg (ref 24.7–32.8)
MCHC: 35.2 g/dL (ref 32.3–35.6)
MCV: 87 fL (ref 75.5–95.3)
MONOCYTE #: 0.5 x10ˆ3/uL (ref 0.20–0.90)
MONOCYTE %: 9 % (ref 4–11)
MPV: 7.7 fL — ABNORMAL LOW (ref 7.9–10.8)
NEUTROPHIL #: 2.4 x10ˆ3/uL (ref 1.90–8.20)
NEUTROPHIL %: 41 % — ABNORMAL LOW (ref 43–77)
PLATELETS: 181 x10ˆ3/uL (ref 140–440)
RBC: 4.08 x10ˆ6/uL (ref 3.63–4.92)
RDW: 15 % (ref 12.3–17.7)
WBC: 5.9 x10ˆ3/uL (ref 3.8–11.8)

## 2024-07-29 LAB — MAGNESIUM: MAGNESIUM: 2 mg/dL (ref 1.9–2.7)

## 2024-07-29 LAB — POC BLOOD GLUCOSE (RESULTS)
GLUCOSE, POC: 157 mg/dL — ABNORMAL HIGH (ref 70–100)
GLUCOSE, POC: 259 mg/dL — ABNORMAL HIGH (ref 70–100)

## 2024-07-29 NOTE — Assessment & Plan Note (Deleted)
"      Continue lisinopril          "

## 2024-07-29 NOTE — Assessment & Plan Note (Deleted)
 Symptoms present for last 3 months since starting new medications (ozempic/Trulicity)   HA1C 7.9.  HIV negative.   Stool studies ordered (GI biofire panel, osmo, wbc, lactoferrin, electrolytes, lipids, calprotectin)  Blood cultures pending.   Urine culture pending (no reported symptoms)

## 2024-07-29 NOTE — Assessment & Plan Note (Deleted)
"  Continue atorvastatin   "

## 2024-07-29 NOTE — Discharge Summary (Signed)
 [place] [facility] ([facility])     DISCHARGE SUMMARY      PATIENT NAME:  Brandy Flores   MRN:  [id number]  DOB:  03-06-1956    INPATIENT ADMISSION DATE: 07/28/2024   DATE OF DISCHARGE:  07/29/2024     ATTENDING PHYSICIAN:  Rachelle Blower, MD    HOSPITAL PRESENTATION:  Please see full admission H&P for details.    As per HPI:  Brandy Flores is a 68 y.o., White female who presents from [facility] with complaints of n/v/d for at least 3 months now. States she fell and broke her hip a while back, but they won't do surgery on it until she gets her sugar under control. So she went to PCP>has been trying different medications to get sugar under control. States n/v/d started when she was started on ozempic/Trulicity that she can't eat anything it either comes out by vomiting or diarrhea. States she didn't tolerate Jardiance either. Was able and has tolerated metformin. Asked if she has attempted any form of long-acting insulin , meal time coverage insulin > Patient states she doesn't want insulin  that is what caused her mom to loose her foot. However, also states she will take whatever she needs for now to get sugar under control so she can have her hip fixed.     FURTHER HOSPITAL COURSE:  Patient admitted with chronic diarrhea, nausea, vomiting, type 2 diabetes, hypertension, hyperlipidemia, and chronic conditions.  Patient's nausea vomiting diarrhea have been present for the last 3 months since starting new medication of Ozempic/Trulicity.  States she was unable to tolerate Jardiance as well.  Hemoglobin A1c is 7.9.  HIV testing was negative.  Blood cultures are negative to date.  Patient is started on Accu-Cheks with insulin  sliding scale coverage.  Home medications continued as appropriate.  Stool studies were ordered, however, patient's diarrhea resolved.  Patient had no bowel movements during admission.  Patient feels back to baseline.  Tolerating oral intake.  No complaints of urinary symptoms.   Attempted to discuss diabetic diet and diabetic regimen with patient.  Offered endocrinology referral patient agreeable.  Patient to be discharged home with family.  Stopped Trulicity and Jardiance.  Continue home metformin and glipizide.  Encouraged to make lifestyle modifications and follow diabetic diet.  Patient will need to follow up with PCP within 1 week.  Follow up with endocrinology as soon as possible for further diabetic management.    PROBLEM LIST:  Assessment & Plan  Chronic diarrhea  Nausea and vomiting    Diabetes mellitus type 2, noninsulin dependent (CMS HCC)    Hypercholesterolemia    Benign essential HTN       Active Non-Hospital Problems    Diagnosis Date Noted    UTI (urinary tract infection) 04/17/2022    Closed left hip fracture 04/13/2022    Food impaction of esophagus 09/30/2021    GERD without esophagitis 01/07/2021    Grief reaction 01/07/2021    Fatigue 01/07/2021       DISCHARGE MEDICATIONS:     Current Discharge Medication List        PAUSE taking these medications        Details   BASAGLAR KWIKPEN U-100 INSULIN  SUBQ  Wait to take this until your doctor or other care provider tells you to start again.   20 Units, NIGHTLY  Refills: 0     Jardiance 25 mg Tablet  Wait to take this until your doctor or other care provider tells you to start again.  Generic drug: empagliflozin   25 mg, Daily  Refills: 0     Trulicity 0.75 mg/0.5 mL Pen Injector  Wait to take this until your doctor or other care provider tells you to start again.  Generic drug: dulaglutide   0.5 mL, EVERY 7 DAYS  Refills: 0            CONTINUE these medications - NO CHANGES were made during your visit.        Details   chlorTHALIDONE  25 mg Tablet  Commonly known as: HYGROTON    25 mg, Daily  Refills: 0     glipiZIDE 10 mg Tablet Extended Rel 24 hr  Commonly known as: GLUCOTROL XL   10 mg, EVERY MORNING WITH BREAKFAST  Refills: 0     HYDROcodone -acetaminophen  10-325 mg Tablet  Commonly known as: NORCO   1 Tablet, EVERY 6 HOURS  PRN  Refills: 0     lisinopriL  2.5 mg Tablet  Commonly known as: PRINIVIL    2.5 mg, Daily  Refills: 0     MetFORMIN 1,000 mg Tablet  Commonly known as: GLUCOPHAGE   1,000 mg, 2 TIMES DAILY WITH FOOD  Refills: 0     oxyBUTYnin  5 mg Tablet Extended Rel 24 hr  Commonly known as: DITROPAN  XL   5 mg, Daily  Refills: 0     pantoprazole  40 mg Tablet, Delayed Release (E.C.)  Commonly known as: PROTONIX    40 mg, Daily  Refills: 0     simvastatin 20 mg Tablet  Commonly known as: ZOCOR   20 mg, EVERY EVENING  Refills: 0            STOP taking these medications.      cefdinir  300 mg Capsule  Commonly known as: OMNICEF      ketorolac  tromethamine  10 mg Tablet  Commonly known as: TORADOL      nitroGLYCERIN 0.4 mg Tablet, Sublingual  Commonly known as: NITROSTAT     predniSONE  20 mg Tablet  Commonly known as: DELTASONE      traMADoL  50 mg Tablet  Commonly known as: ULTRAM              DISCHARGE DISPOSITION:  home with family    DISCHARGE INSTRUCTIONS:  No discharge procedures on file.   Follow-up Information       Tobie Dorothe RAMAN, MD Follow up in 1 week(s).    Specialties: INTERNAL MEDICINE, INTERNAL MEDICINE  Why: Follow-up  *Unit secretary will call with appt date and time  Contact information:  [address] [facility] MEDICAL [facility]  [place] [address]  [phone]        [facility] Medicine Endocrinology Follow up.    Why: Follow up 2 weeks for diabetes management  *Unit secretary will call with appt date and time  Contact information:  [address]  [place], [place] [address]  Phone:[phone]             Isolation Status:  Contact II  Copies sent to Care Team         Relationship Specialty Notifications Start End    Tobie Dorothe RAMAN, MD PCP - General INTERNAL MEDICINE  01/19/23     Phone: [phone] Fax: [phone]         [address] [place] [address]    Joesph Kocher, DO  ORTHOPAEDIC SURGERY  04/20/22     Phone: [phone] Fax: [phone]         [address] [place] [address]  PHYSICAL EXAM DAY OF  DISCHARGE:  BP 118/65   Pulse 73   Temp 36.7 C (98.1 F)   Resp 16   Ht 1.626 m (5' 4)   Wt 119 kg (262 lb 4.8 oz)   SpO2 98%   BMI 45.02 kg/m      Physical Exam  Constitutional:       General: She is not in acute distress.  Pulmonary:      Effort: No respiratory distress.   Neurological:      General: No focal deficit present.      Mental Status: She is alert.     Nutrition:    DIET DIABETIC Calorie amount: CC 1800; Do you want to initiate MNT Protocol? Yes    Additional clinical characteristics related to nutrition:    - monitor for weight changes   - monitor intake and output    - monitor bowel functions          LABS WITHIN LAST 24 HOURS:   Results for orders placed or performed during the hospital encounter of 07/28/24 (from the past 24 hours)   POC BLOOD GLUCOSE (RESULTS)   Result Value Ref Range    GLUCOSE, POC 161 (H) 70 - 100 mg/dl   POC BLOOD GLUCOSE (RESULTS)   Result Value Ref Range    GLUCOSE, POC 190 (H) 70 - 100 mg/dl   COMPREHENSIVE METABOLIC PANEL, NON-FASTING   Result Value Ref Range    SODIUM 141 136 - 145 mmol/L    POTASSIUM 3.8 3.5 - 5.1 mmol/L    CHLORIDE 110 (H) 98 - 107 mmol/L    CO2 TOTAL 21 21 - 31 mmol/L    ANION GAP 10 4 - 13 mmol/L    BUN 9 7 - 25 mg/dL    CREATININE 9.21 9.39 - 1.30 mg/dL    BUN/CREA RATIO 12 6 - 22    ESTIMATED GFR 82 >59 mL/min/1.65m2    ALBUMIN 3.8 3.5 - 5.7 g/dL    CALCIUM 8.6 8.6 - 89.6 mg/dL    GLUCOSE 845 (H) 74 - 109 mg/dL    ALKALINE PHOSPHATASE 53 34 - 104 U/L    ALT (SGPT) 14 7 - 52 U/L    AST (SGOT) 20 13 - 39 U/L    BILIRUBIN TOTAL 0.6 0.3 - 1.0 mg/dL    PROTEIN TOTAL 6.3 (L) 6.4 - 8.9 g/dL    ALBUMIN/GLOBULIN RATIO 1.5 (H) 0.8 - 1.4    OSMOLALITY, CALCULATED 283 270 - 290 mOsm/kg    CALCIUM, CORRECTED 8.8 (L) 8.9 - 10.8 mg/dL    GLOBULIN 2.5 2.0 - 3.5   MAGNESIUM    Result Value Ref Range    MAGNESIUM  2.0 1.9 - 2.7 mg/dL   CBC WITH DIFF   Result Value Ref Range    WBC 5.9 3.8 - 11.8 x103/uL    RBC 4.08 3.63 - 4.92 x106/uL    HGB 12.5 10.9 -  14.3 g/dL    HCT 64.4 68.7 - 58.0 %    MCV 87.0 75.5 - 95.3 fL    MCH 30.6 24.7 - 32.8 pg    MCHC 35.2 32.3 - 35.6 g/dL    RDW 84.9 87.6 - 82.2 %    PLATELETS 181 140 - 440 x103/uL    MPV 7.7 (L) 7.9 - 10.8 fL    NEUTROPHIL % 41 (L) 43 - 77 %    LYMPHOCYTE % 46 16 - 46 %    MONOCYTE % 9 4 -  11 %    EOSINOPHIL % 4 1 - 7 %    BASOPHIL % 1 0 - 1 %    NEUTROPHIL # 2.40 1.90 - 8.20 x103/uL    LYMPHOCYTE # 2.70 1.10 - 3.10 x103/uL    MONOCYTE # 0.50 0.20 - 0.90 x103/uL    EOSINOPHIL # 0.20 0.00 - 0.50 x103/uL    BASOPHIL # 0.00 0.00 - 0.10 x103/uL   POC BLOOD GLUCOSE (RESULTS)   Result Value Ref Range    GLUCOSE, POC 157 (H) 70 - 100 mg/dl   POC BLOOD GLUCOSE (RESULTS)   Result Value Ref Range    GLUCOSE, POC 259 (H) 70 - 100 mg/dl        IMAGING WITHIN LAST 24 HOURS:   CT ABDOMEN PELVIS W IV CONTRAST  Result Date: 07/28/2024  Impression Findings which can be seen with enteritis as described above. Nonacute findings as described above. Radiologist location ID: [id number]     CT ABDOMEN PELVIS W IV CONTRAST   Final Result   Findings which can be seen with enteritis as described above.      Nonacute findings as described above.               Radiologist location ID: [id number]              MICROBIOLOGY WITHIN LAST 24 HOURS:   No results found for any visits on 07/28/24 (from the past 24 hours).     The hospitalist examined patient, reviewed material, and agreed with discharge at this time.   >30 minutes total were spent coordinating discharge day today    This note was partially generated using MModal Fluency Direct system, and there may be some incorrect words, spellings, and punctuation that were not noted in checking the note before saving.    Duwaine Shropshire, APRN, CNP  [facility] MEDICINE HOSPITALIST

## 2024-07-29 NOTE — Nurses Notes (Signed)
Patient discharged home with family.  AVS reviewed with patient/care giver.  A written copy of the AVS and discharge instructions was given to the patient/care giver. Scripts handed to patient/care giver. Questions sufficiently answered as needed.  Patient/care giver encouraged to follow up with PCP as indicated.  In the event of an emergency, patient/care giver instructed to call 911 or go to the nearest emergency room. IV and telemetry removed. Patient transported via wheelchair.

## 2024-07-29 NOTE — Assessment & Plan Note (Deleted)
 Place on accu-checks with [facility] at this time.

## 2024-07-30 DIAGNOSIS — R9431 Abnormal electrocardiogram [ECG] [EKG]: Secondary | ICD-10-CM

## 2024-07-30 LAB — ECG 12 LEAD
Atrial Rate: 93 {beats}/min
Calculated P Axis: 63 degrees
Calculated R Axis: 5 degrees
Calculated T Axis: 48 degrees
PR Interval: 176 ms
QRS Duration: 68 ms
QT Interval: 358 ms
QTC Calculation: 445 ms
Ventricular rate: 93 {beats}/min

## 2024-07-30 LAB — URINE CULTURE,ROUTINE: URINE CULTURE: >100000 — AB

## 2024-08-02 LAB — ADULT ROUTINE BLOOD CULTURE, SET OF 2 BOTTLES (BACTERIA AND YEAST)
BLOOD CULTURE, ROUTINE: NO GROWTH
BLOOD CULTURE, ROUTINE: NO GROWTH

## 2024-08-03 ENCOUNTER — Ambulatory Visit (HOSPITAL_BASED_OUTPATIENT_CLINIC_OR_DEPARTMENT_OTHER): Payer: Self-pay

## 2024-10-11 ENCOUNTER — Ambulatory Visit (HOSPITAL_BASED_OUTPATIENT_CLINIC_OR_DEPARTMENT_OTHER): Payer: Self-pay

## 2024-10-11 ENCOUNTER — Encounter (HOSPITAL_BASED_OUTPATIENT_CLINIC_OR_DEPARTMENT_OTHER): Payer: Self-pay
